# Patient Record
Sex: Male | Born: 1955 | Race: Black or African American | Hispanic: No | Smoking: Current every day smoker
Health system: Southern US, Community
[De-identification: ages and names within clinical notes are randomized; demographics above are authoritative.]

## PROBLEM LIST (undated history)

## (undated) ENCOUNTER — Ambulatory Visit: Source: Home / Self Care

## (undated) ENCOUNTER — Ambulatory Visit (HOSPITAL_COMMUNITY): Admission: EM | Payer: 59 | Source: Home / Self Care

## (undated) DIAGNOSIS — N289 Disorder of kidney and ureter, unspecified: Secondary | ICD-10-CM

## (undated) DIAGNOSIS — B1920 Unspecified viral hepatitis C without hepatic coma: Secondary | ICD-10-CM

## (undated) DIAGNOSIS — H409 Unspecified glaucoma: Secondary | ICD-10-CM

## (undated) DIAGNOSIS — H269 Unspecified cataract: Secondary | ICD-10-CM

## (undated) DIAGNOSIS — H9190 Unspecified hearing loss, unspecified ear: Secondary | ICD-10-CM

---

## 1999-03-17 ENCOUNTER — Emergency Department (HOSPITAL_COMMUNITY): Admission: EM | Admit: 1999-03-17 | Discharge: 1999-03-17 | Payer: Self-pay | Admitting: Emergency Medicine

## 1999-03-17 ENCOUNTER — Encounter: Payer: Self-pay | Admitting: Emergency Medicine

## 1999-08-03 ENCOUNTER — Emergency Department (HOSPITAL_COMMUNITY): Admission: EM | Admit: 1999-08-03 | Discharge: 1999-08-03 | Payer: Self-pay | Admitting: Emergency Medicine

## 2001-11-03 ENCOUNTER — Emergency Department (HOSPITAL_COMMUNITY): Admission: EM | Admit: 2001-11-03 | Discharge: 2001-11-03 | Payer: Self-pay | Admitting: *Deleted

## 2002-11-06 ENCOUNTER — Emergency Department (HOSPITAL_COMMUNITY): Admission: EM | Admit: 2002-11-06 | Discharge: 2002-11-06 | Payer: Self-pay | Admitting: Emergency Medicine

## 2006-09-09 ENCOUNTER — Emergency Department (HOSPITAL_COMMUNITY): Admission: EM | Admit: 2006-09-09 | Discharge: 2006-09-09 | Payer: Self-pay | Admitting: Emergency Medicine

## 2006-09-15 ENCOUNTER — Emergency Department (HOSPITAL_COMMUNITY): Admission: EM | Admit: 2006-09-15 | Discharge: 2006-09-15 | Payer: Self-pay | Admitting: Emergency Medicine

## 2006-10-07 ENCOUNTER — Emergency Department (HOSPITAL_COMMUNITY): Admission: EM | Admit: 2006-10-07 | Discharge: 2006-10-07 | Payer: Self-pay | Admitting: Emergency Medicine

## 2006-11-04 ENCOUNTER — Emergency Department (HOSPITAL_COMMUNITY): Admission: EM | Admit: 2006-11-04 | Discharge: 2006-11-04 | Payer: Self-pay | Admitting: Emergency Medicine

## 2006-11-25 ENCOUNTER — Ambulatory Visit: Payer: Self-pay | Admitting: Internal Medicine

## 2008-05-08 ENCOUNTER — Emergency Department (HOSPITAL_COMMUNITY): Admission: EM | Admit: 2008-05-08 | Discharge: 2008-05-08 | Payer: Self-pay | Admitting: Emergency Medicine

## 2008-12-03 ENCOUNTER — Emergency Department (HOSPITAL_COMMUNITY): Admission: EM | Admit: 2008-12-03 | Discharge: 2008-12-03 | Payer: Self-pay | Admitting: Emergency Medicine

## 2008-12-13 ENCOUNTER — Emergency Department (HOSPITAL_COMMUNITY): Admission: EM | Admit: 2008-12-13 | Discharge: 2008-12-13 | Payer: Self-pay | Admitting: Emergency Medicine

## 2010-01-07 ENCOUNTER — Emergency Department (HOSPITAL_COMMUNITY)
Admission: EM | Admit: 2010-01-07 | Discharge: 2010-01-07 | Payer: Self-pay | Source: Home / Self Care | Admitting: Emergency Medicine

## 2010-01-23 ENCOUNTER — Encounter (INDEPENDENT_AMBULATORY_CARE_PROVIDER_SITE_OTHER): Payer: Self-pay | Admitting: *Deleted

## 2010-01-23 ENCOUNTER — Ambulatory Visit: Payer: Self-pay | Admitting: Internal Medicine

## 2010-01-23 LAB — CONVERTED CEMR LAB: HCV Quantitative: 2160000 intl units/mL — ABNORMAL HIGH (ref <43)

## 2010-04-11 ENCOUNTER — Emergency Department (HOSPITAL_COMMUNITY)
Admission: EM | Admit: 2010-04-11 | Discharge: 2010-04-11 | Payer: Self-pay | Source: Home / Self Care | Admitting: Emergency Medicine

## 2010-05-29 ENCOUNTER — Emergency Department (HOSPITAL_COMMUNITY): Payer: Medicaid Other

## 2010-05-29 ENCOUNTER — Inpatient Hospital Stay (HOSPITAL_COMMUNITY)
Admission: EM | Admit: 2010-05-29 | Discharge: 2010-05-31 | DRG: 375 | Disposition: A | Discharge status: Home / Self Care | Payer: Medicaid Other | Attending: Internal Medicine | Admitting: Internal Medicine

## 2010-05-29 DIAGNOSIS — D6959 Other secondary thrombocytopenia: Secondary | ICD-10-CM | POA: Diagnosis present

## 2010-05-29 DIAGNOSIS — F17200 Nicotine dependence, unspecified, uncomplicated: Secondary | ICD-10-CM | POA: Diagnosis present

## 2010-05-29 DIAGNOSIS — N182 Chronic kidney disease, stage 2 (mild): Secondary | ICD-10-CM | POA: Diagnosis present

## 2010-05-29 DIAGNOSIS — M899 Disorder of bone, unspecified: Secondary | ICD-10-CM | POA: Diagnosis present

## 2010-05-29 DIAGNOSIS — K5732 Diverticulitis of large intestine without perforation or abscess without bleeding: Secondary | ICD-10-CM | POA: Diagnosis present

## 2010-05-29 DIAGNOSIS — F1020 Alcohol dependence, uncomplicated: Secondary | ICD-10-CM | POA: Diagnosis present

## 2010-05-29 DIAGNOSIS — C189 Malignant neoplasm of colon, unspecified: Principal | ICD-10-CM | POA: Diagnosis present

## 2010-05-29 DIAGNOSIS — B1920 Unspecified viral hepatitis C without hepatic coma: Secondary | ICD-10-CM | POA: Diagnosis present

## 2010-05-29 DIAGNOSIS — R3911 Hesitancy of micturition: Secondary | ICD-10-CM | POA: Diagnosis present

## 2010-05-29 DIAGNOSIS — E871 Hypo-osmolality and hyponatremia: Secondary | ICD-10-CM | POA: Diagnosis present

## 2010-05-29 LAB — BASIC METABOLIC PANEL WITH GFR
BUN: 25 mg/dL — ABNORMAL HIGH (ref 6–23)
CO2: 19 meq/L (ref 19–32)
Calcium: 8.6 mg/dL (ref 8.4–10.5)
Chloride: 106 meq/L (ref 96–112)
Creatinine, Ser: 1.5 mg/dL (ref 0.4–1.5)
GFR calc non Af Amer: 49 mL/min — ABNORMAL LOW
Glucose, Bld: 84 mg/dL (ref 70–99)
Potassium: 3.9 meq/L (ref 3.5–5.1)
Sodium: 133 meq/L — ABNORMAL LOW (ref 135–145)

## 2010-05-29 LAB — URINALYSIS, ROUTINE W REFLEX MICROSCOPIC
Bilirubin Urine: NEGATIVE
Hgb urine dipstick: NEGATIVE
Ketones, ur: 15 mg/dL — AB
Nitrite: NEGATIVE
Protein, ur: NEGATIVE mg/dL
Specific Gravity, Urine: 1.021 (ref 1.005–1.030)
Urine Glucose, Fasting: NEGATIVE mg/dL
Urobilinogen, UA: 0.2 mg/dL (ref 0.0–1.0)
pH: 5 (ref 5.0–8.0)

## 2010-05-29 LAB — DIFFERENTIAL
Basophils Relative: 0 % (ref 0–1)
Eosinophils Absolute: 0.1 10*3/uL (ref 0.0–0.7)
Eosinophils Relative: 1 % (ref 0–5)
Lymphs Abs: 2.3 10*3/uL (ref 0.7–4.0)
Monocytes Relative: 7 % (ref 3–12)

## 2010-05-29 LAB — CBC
HCT: 37.2 % — ABNORMAL LOW (ref 39.0–52.0)
Hemoglobin: 12.8 g/dL — ABNORMAL LOW (ref 13.0–17.0)
MCH: 32.2 pg (ref 26.0–34.0)
MCHC: 34.4 g/dL (ref 30.0–36.0)
MCV: 93.7 fL (ref 78.0–100.0)
Platelets: 112 10*3/uL — ABNORMAL LOW (ref 150–400)
RBC: 3.97 MIL/uL — ABNORMAL LOW (ref 4.22–5.81)
RDW: 12.2 % (ref 11.5–15.5)
WBC: 12.2 10*3/uL — ABNORMAL HIGH (ref 4.0–10.5)

## 2010-05-30 ENCOUNTER — Inpatient Hospital Stay (HOSPITAL_COMMUNITY): Payer: Medicaid Other

## 2010-05-30 ENCOUNTER — Emergency Department (HOSPITAL_COMMUNITY): Payer: Medicaid Other

## 2010-05-30 LAB — PSA: PSA: 0.24 ng/mL (ref <=4.00)

## 2010-05-30 LAB — RAPID URINE DRUG SCREEN, HOSP PERFORMED
Amphetamines: NOT DETECTED
Opiates: POSITIVE — AB
Tetrahydrocannabinol: NOT DETECTED

## 2010-05-30 LAB — HEPATIC FUNCTION PANEL
AST: 33 U/L (ref 0–37)
Bilirubin, Direct: 0.1 mg/dL (ref 0.0–0.3)

## 2010-05-30 LAB — IRON AND TIBC
Iron: 109 ug/dL (ref 42–135)
TIBC: 344 ug/dL (ref 215–435)

## 2010-05-30 MED ORDER — TECHNETIUM TC 99M MEDRONATE IV KIT
25.0000 | PACK | Freq: Once | INTRAVENOUS | Status: AC | PRN
  Administered 2010-05-30: 26 via INTRAVENOUS

## 2010-05-30 MED ORDER — IOHEXOL 300 MG/ML  SOLN
100.0000 mL | Freq: Once | INTRAMUSCULAR | Status: AC | PRN
  Administered 2010-05-30: 100 mL via INTRAVENOUS

## 2010-05-31 LAB — CBC
HCT: 34.8 % — ABNORMAL LOW (ref 39.0–52.0)
MCV: 92.8 fL (ref 78.0–100.0)
RBC: 3.75 MIL/uL — ABNORMAL LOW (ref 4.22–5.81)
RDW: 12.1 % (ref 11.5–15.5)
WBC: 5.3 10*3/uL (ref 4.0–10.5)

## 2010-05-31 LAB — COMPREHENSIVE METABOLIC PANEL
Alkaline Phosphatase: 65 U/L (ref 39–117)
BUN: 9 mg/dL (ref 6–23)
CO2: 24 mEq/L (ref 19–32)
Chloride: 109 mEq/L (ref 96–112)
GFR calc non Af Amer: 54 mL/min — ABNORMAL LOW (ref >60)
Glucose, Bld: 120 mg/dL — ABNORMAL HIGH (ref 70–99)
Potassium: 3.8 mEq/L (ref 3.5–5.1)
Total Bilirubin: 0.5 mg/dL (ref 0.3–1.2)

## 2010-05-31 LAB — PROTIME-INR: Prothrombin Time: 13 seconds (ref 11.6–15.2)

## 2010-05-31 LAB — AMYLASE: Amylase: 153 U/L — ABNORMAL HIGH (ref 0–105)

## 2010-06-03 NOTE — Discharge Summary (Signed)
NAME:  Jeffrey [name], Jeffrey [name] NO.:  [id number]  MEDICAL RECORD NO.:  [id number]           PATIENT TYPE:  LOCATION:                                 FACILITY:  PHYSICIAN:  Andreas Blower, MD       DATE OF BIRTH:  1956/03/08  DATE OF ADMISSION: DATE OF DISCHARGE:                              DISCHARGE SUMMARY   PRIMARY CARE PHYSICIAN:  The patient does not have a primary care physician.  GASTROENTEROLOGIST:  During the hospital stay was Dr. Ewing Schlein.  DISCHARGE DIAGNOSES: 1. Diverticulitis with possible underlying malignancy as noted on CT     scan. 2. Hypodense skeletal lesion with negative bone scan study. 3. Tobacco use. 4. Alcoholism. 5. Chronic kidney disease stage I-II. 6. History of hepatitis C. 7. Thrombocytopenia, likely from alcohol use and hepatitis C. 8. Hyponatremia, resolved. 9. History of glaucoma. 10.History of cataract. 11.History of corneal abrasion. 12.History of occasional urinary retention and hesitancy. 13.History of left shoulder pain.  DISCHARGE MEDICATIONS: 1. Ciprofloxacin 500 mg p.o. twice daily for 12 more days. 2. Folic acid 1 mg p.o. daily. 3. Metronidazole 500 mg p.o. three times a day for 12 days. 4. Nicotine patch 21 mg per 24 hours daily, the patient was instructed     not to smoke while on the patch. 5. Thiamine 100 mg p.o. daily.  BRIEF ADMITTING HISTORY AND PHYSICAL:  Jeffrey [name] is a 55 year old African American gentleman with no significant past medical history who has no primary care physician, who drinks about four beers per day and smokes about one pack per day, presented with abdominal pain, nausea, vomiting.  RADIOLOGY/IMAGING:  The patient had CT of the abdomen and pelvis with contrast which showed focal soft tissue prominence and some free fluid in the proximal sigmoid and descending colon.  This likely represents focal diverticulitis and neoplasm is not excluded.  Mild stranding of the small bowel mesentery  without focal soft tissue lesion.  Multiple hypodense lesions in the skeleton could represent metastatic disease as the patient has a known cancer. The patient had a chest x-ray, two view, which was negative chest. The patient had a whole body bone scan on May 30, 2010, which shows no evidence of abnormal osseous septic.  CONSULTATIONS OBTAINED:  Petra Kuba, MD, with [facility], was consulted for probable diverticulitis and for ruling out neoplasm to determine to see if the patient needs a colonoscopy.  LABORATORY DATA:  CBC shows a white count of 5.3, hemoglobin 11.9, hematocrit 34.8, platelet count 117.  Electrolytes normal with a creatinine of 1.38.  Liver function tests normal except albumin is 2.9, alkaline phosphatase was normal.  Amylase was 153.  Anemia panel was normal.  PSA was 0.24.  TSH was 1.711.  Urine drug screen was positive for opiates.  UA was negative for nitrites and leukocytes.  Blood cultures x2 shows no growth to date.  DISPOSITION AND FOLLOWUP:  The patient was instructed to call Medicaid to determine which provider would accept the Medicaid and be his primary care physician.  The patient reports that he has a number at home that  he can call to establish a primary care physician.  The patient was instructed to call Dr. Ewing Schlein with GI for an appointment in 1-2 weeks.  HOSPITAL COURSE BY PROBLEM: 1. Abdominal pain, likely due to diverticulitis, however, could not     exclude neoplasm.  The patient was started on ciprofloxacin and     metronidazole which were transitioned from IV to oral during the     course of hospital stay.  The patient will continue antibiotics for     12 more days to complete a 14-day course of antibiotics.  After     discharge, the patient was instructed to call Dr. Ewing Schlein for     followup and for colonoscopy as an outpatient.  The patient's PSA     was checked and was normal, alk phos was normal.  Colonoscopy will     help to  determine to see if he has any possible underlying     neoplasm. 2. Hypodense skeletal lesions as noted on CT.  The patient had a whole     body bone scan which was normal.  Alkaline phosphatase was normal.     PSA was normal. 3. Tobacco use.  I had an extensive discussion with the patient about     smoking cessation and spent more than 10 minutes counseling the     patient.  Nicotine patches were prescribed at discharge. 4. Alcohol use.  The patient was instructed on stopping alcohol use.     The patient is given thiamine and folate.  The patient was offered     resources to be provided for alcohol cessation.  He indicated that     he is going to try quitting and drinking on his own. 5. Chronic kidney disease, stage I/II.  Continue to follow up with his     primary care physician for further evaluation as an outpatient. 6. Thrombocytopenia, most likely secondary to alcohol use and     hepatitis C. 7. Hyponatremia, resolved. 8. History of hepatitis C, not an active issue at this time.  TIME SPENT ON DISCHARGE:  Talking to the patient and coordinating care was 25 minutes.     Andreas Blower, MD     SR/MEDQ  D:  05/31/2010  T:  06/01/2010  Job:  [id number]  Electronically Signed by Wardell Heath Hersel Mcmeen  on 06/03/2010 09:01:27 PM

## 2010-06-05 LAB — CULTURE, BLOOD (ROUTINE X 2)
Culture  Setup Time: 201202241107
Culture: NO GROWTH

## 2010-06-12 NOTE — H&P (Signed)
NAME:  Jeffrey, [name] NO.:  [id number]  MEDICAL RECORD NO.:  [id number]           PATIENT TYPE:  E  LOCATION:  [facility]                         FACILITY:  [facility]  PHYSICIAN:  Lonia Blood, M.D.      DATE OF BIRTH:  09/03/1955  DATE OF ADMISSION:  05/29/2010 DATE OF DISCHARGE:                             HISTORY & PHYSICAL   PRIMARY CARE PHYSICIAN:  He is unassigned to [place].  PRESENTING COMPLAINT:  Abdominal pain, fever and nausea.  HISTORY OF PRESENT ILLNESS:  The patient is a 55 year old gentleman with no significant past medical history, who has no primary care physician and not been following for regular follow ups.  He is a heavy drinker and smoker.  He came to the ER due to persistent left lower quadrant abdominal pain associated with some nausea.  The patient also has had some fever and chills.  He described "his bowel locked up on him" last Tuesday.  This has been going on for 3 days.  He has no bowel movement since then.  He saw some black stools on Tuesday, which is 2 days ago. No bright red blood per rectum.  No hematemesis.  His pain is rated as 7/10 worsened with movement and relieved by some rest.  He has no prior history of pain.  He has never had a colonoscopy or an EGD and no prior GI workup.  PAST MEDICAL HISTORY:  Significant for 1. Cataract. 2. Corneal abrasion. 3. History of glaucoma. 4. Hepatitis C. 5. History of left shoulder pain. 6. Occasional urinary retention and hesitancy.  ALLERGIES:  HE HAS NO KNOWN DRUG ALLERGIES.  MEDICATIONS:  He is not on any medicine at this point.  SOCIAL HISTORY:  The patient lives in [place].  He is divorced with 2 grown-up children.  He smokes about one and a half pack per day.  Drinks up to 6 packs beer a day.  Denied any IV drug use or any illicit drugs.  FAMILY HISTORY:  Mother is alive at the age of 80.  She had lung cancer and some uterine cancer.  She smokes also excessively.  His father  is also alive, but also has history of lung cancer.  REVIEW OF SYSTEMS:  Denied any weight loss.  Otherwise, all systems reviewed are negative except per HPI.  PHYSICAL EXAMINATION:  VITAL SIGNS:  Temperature is 99.2 orally, blood pressure 120/73, pulse 180, respiratory 20, sats 98% room air. GENERAL:  The patient is awake, alert, oriented, small-framed individual.  He is in no acute distress. HEENT:  PERRL.  EOMI.  No pallor, no jaundice.  No rhinorrhea. NECK:  Supple.  No visible JVD, no lymphadenopathy. RESPIRATORY:  He has good air entry bilaterally.  No wheezes, no rales, no crackles. CARDIOVASCULAR SYSTEM:  He is mildly tachycardiac. ABDOMEN:  Full, soft with left lower quadrant abdominal tenderness with mild guarding.  Stool guaiac is positive. EXTREMITIES:  No edema, cyanosis or clubbing. SKIN:  No rashes.  No ulcers. MUSCULOSKELETAL:  No joint swelling, tenderness, or limitation in range of motion.  LABORATORY DATA:  White count is 12.2 with  left shift, ANC of 8.9. Hemoglobin is 12.8, platelet count of 112,000.  Sodium is 133, potassium 3.9, chloride 106, CO2 of 19, glucose 84, BUN 25, creatinine 1.50, calcium 8.6.  His urinalysis is so far negative except for some mild ketones.  LFTs are currently pending.  His chest x-ray shows negative findings.  CT abdomen and pelvis showed focal soft tissue prominence and some free fluid in the proximal sigmoid and descending colon likely representing focal diverticulitis.  A neoplasm has not been excluded. He has mild stranding of the small bowel mesentery without a focal soft tissue lesion, which may be associated with a nonspecific enteritis.  He has multiple hypodense lesions in the skeleton.  These involved the vertebral body L3 as well as the left iliac bone.  Questionable metastatic disease.  ASSESSMENT:  This is a 55 year old gentleman who is a heavy smoker and drinker, with family history of multiple cancers, presenting  with what appears to be acute diverticulitis, bone lesions and possible neoplasm. The patient denied any weight loss.  There is no prior history of cancer, although he has all the risk factors.  PLAN: 1. Acute diverticulitis, we will admit the patient, get blood     cultures, bowel rest, pain control and start him on Cipro, Flagyl.     The patient will need GI consult.  If this lesion did not disappear     or improve on antibiotics, he will need colonoscopy and biopsy to     rule out tumor. 2. Bone lesions.  It is not clear if this is truly metastatic disease.     We will do a total body bone scan.  Depending on the findings, we     have to think of ruling out myeloma.  The findings are hypodense,     not sclerotic, however, the patient is complaining of urinary     symptoms, so we need to think of prostate cancer as a possibility.     At this point, I will order a PSA panel, does a screening. 3. Tobacco abuse.  We will start tobacco cessation counseling and I     will put nicotine patch. 4. Alcohol abuse.  We will watch the patient for alcohol withdrawal,     in the meantime, I will put him on some Ativan as needed.  Once we     see signs of withdrawal, we will start CIWA  protocol. 5. History of hepatitis C.  The patient has not had any follow-up     apparently in a long time.  He is thrombocytopenic, we will check     his LFTs and see what the possible level of his liver functioning     is. 6. Thrombocytopenia.  Again, there is a high chance that the patient     is not cirrhotic.  We will check his liver function and continue to     monitor.  He needs to be counseled at post hospitalization, he will     need to follow-up with someone.  He may require right upper     quadrant abdominal ultrasound if his LFTs are well elevated. 7. Hyponatremia.  Again, may be related to some liver disease or some     mild dehydration.  Further treatment will depend on the patient's     response as  mentioned.     Lonia Blood, M.D.     Verlin Grills  D:  05/30/2010  T:  05/30/2010  Job:  [id number]  Electronically Signed by Lonia Blood M.D. on 06/12/2010 06:30:51 AM

## 2010-06-13 NOTE — Consult Note (Signed)
NAME:  Jeffrey [name], Jeffrey [name] NO.:  [id number]  MEDICAL RECORD NO.:  [id number]           PATIENT TYPE:  I  LOCATION:  6705                         FACILITY:  [facility]  PHYSICIAN:  Petra Kuba, M.D.    DATE OF BIRTH:  Jun 12, 1955  DATE OF CONSULTATION:  05/30/2010 DATE OF DISCHARGE:                                CONSULTATION   Patient seen at the request of the medical staff with a few-day history of change in bowel habits, some abdominal pain, and nausea and vomiting. He was admitted with probable diverticulitis, but on CT cannot rule out colon cancer.  We are consulted for further workup and plans.  He does have a history of drug abuse and hepatitis C, and has had no previous colonic procedure.  PAST MEDICAL HISTORY:  Pertinent for cataracts and corneal abrasions, glaucoma, hep C, left shoulder pain, and occasional urinary symptoms.  ALLERGIES:  None.  MEDICATIONS AT HOME:  None.  Currently, he is on antibiotics.  SOCIAL HISTORY:  He does smoke and does drink beer, currently denying any IV drug use or illicit drugs.  CURRENT MEDICATIONS:  In the hospital include albuterol, Cipro, Lovenox, folic acid, Atrovent, Flagyl, thiamine, lorazepam, morphine, Zofran, Phenergan.  FAMILY HISTORY:  Negative for any GI problems.  REVIEW OF SYSTEMS:  Pertinent for an hour ago was sitting up in his room, eating lunch, doing well, but after the bone scan was feeling just weak and tired.  PHYSICAL EXAMINATION:  Abdomen is soft, nontender.  He is currently not having any guarding or rebound.  He was guaiac positive per the ER physician.  CT was reviewed with Charlett Nose, Radiology.  Pertinent for probable diverticulitis without significant adenopathy.  The liver is normal. There is a question of some bone lesions on the CT as well, and that is why the bone scan is done, but the results are pending.  Labs pertinent for white count of 12.2, hemoglobin 12.8 with an MCV of 93,  platelet count of 112, BUN of 25, creatinine of 1.5.  Previous creatinine was 1.6 and a previous hemoglobin was 12.7, MCV 95 in the past with platelets of 129.  Liver tests were normal except for an albumin of 3.0.  ASSESSMENT: 1. Probable diverticulitis, cannot rule out colon cancer. 2. Bone lesions, questionable etiology.  Bone scan pending. 3. Hepatitis C with normal liver tests and normal liver and spleen on     CT scan.  PLAN:  We will treat with 10-14 days of antibiotics and then see back in the office and schedule an outpatient colonoscopy to confirm the diverticular disease and rule out any other etiologies like colon cancer.  I have discussed that with the patient and wrote my name and number on his discharge paperwork.  Please call one of my partners or myself if any questions or problems during this hospitalization that we can help with.  Otherwise, wait on bone scan results.  However, it would be rare for colon cancer to go to bones and agree with the PSA which is drawn and pending which would be much more common, but  if question about colon cancer, could also get a CEA as well.          ______________________________ Petra Kuba, M.D.     MEM/MEDQ  D:  05/30/2010  T:  05/31/2010  Job:  [id number]  Electronically Signed by Vida Rigger M.D. on 06/12/2010 08:03:19 AM

## 2010-06-19 LAB — DIFFERENTIAL
Basophils Absolute: 0.1 10*3/uL (ref 0.0–0.1)
Basophils Relative: 1 % (ref 0–1)
Lymphocytes Relative: 19 % (ref 12–46)
Monocytes Absolute: 0.9 10*3/uL (ref 0.1–1.0)
Neutro Abs: 6.5 10*3/uL (ref 1.7–7.7)

## 2010-06-19 LAB — CBC
HCT: 37.3 % — ABNORMAL LOW (ref 39.0–52.0)
MCHC: 34 g/dL (ref 30.0–36.0)
Platelets: 129 10*3/uL — ABNORMAL LOW (ref 150–400)
RDW: 11.9 % (ref 11.5–15.5)
WBC: 9.4 10*3/uL (ref 4.0–10.5)

## 2010-06-19 LAB — POCT I-STAT, CHEM 8
BUN: 15 mg/dL (ref 6–23)
Calcium, Ion: 1.05 mmol/L — ABNORMAL LOW (ref 1.12–1.32)
Chloride: 99 mEq/L (ref 96–112)
HCT: 41 % (ref 39.0–52.0)
Sodium: 135 mEq/L (ref 135–145)
TCO2: 30 mmol/L (ref 0–100)

## 2010-12-30 ENCOUNTER — Encounter: Payer: Self-pay | Admitting: Internal Medicine

## 2011-01-11 ENCOUNTER — Ambulatory Visit (HOSPITAL_COMMUNITY)
Admission: RE | Admit: 2011-01-11 | Discharge: 2011-01-11 | Disposition: A | Discharge status: Home / Self Care | Payer: Medicaid Other | Source: Ambulatory Visit | Attending: Internal Medicine | Admitting: Internal Medicine

## 2011-01-11 ENCOUNTER — Other Ambulatory Visit (HOSPITAL_COMMUNITY): Payer: Self-pay | Admitting: Internal Medicine

## 2011-01-11 DIAGNOSIS — R05 Cough: Secondary | ICD-10-CM | POA: Insufficient documentation

## 2011-01-11 DIAGNOSIS — R059 Cough, unspecified: Secondary | ICD-10-CM | POA: Insufficient documentation

## 2011-01-11 DIAGNOSIS — R0602 Shortness of breath: Secondary | ICD-10-CM

## 2011-01-29 ENCOUNTER — Ambulatory Visit: Payer: Medicaid Other | Admitting: Internal Medicine

## 2011-02-10 ENCOUNTER — Encounter: Payer: Self-pay | Admitting: Internal Medicine

## 2011-03-04 ENCOUNTER — Ambulatory Visit: Payer: Medicaid Other | Admitting: Internal Medicine

## 2011-03-11 ENCOUNTER — Encounter: Payer: Self-pay | Admitting: Internal Medicine

## 2011-03-16 ENCOUNTER — Ambulatory Visit: Payer: Medicaid Other | Admitting: Internal Medicine

## 2011-04-30 ENCOUNTER — Ambulatory Visit (INDEPENDENT_AMBULATORY_CARE_PROVIDER_SITE_OTHER): Payer: Medicaid Other | Admitting: Gastroenterology

## 2011-04-30 DIAGNOSIS — B182 Chronic viral hepatitis C: Secondary | ICD-10-CM

## 2011-04-30 DIAGNOSIS — K7030 Alcoholic cirrhosis of liver without ascites: Secondary | ICD-10-CM

## 2011-05-07 NOTE — Progress Notes (Signed)
NAME:  Jeffrey [name], Jeffrey [name]  MR#:  [id number]      DATE:  04/30/2011  DOB:  [birth year]    cc: Consulting Physician:  None. Primary care physician:  Same. Referring Physician:  Quitman Livings, MD, [facility], [address] [place], [place] [address], Phone [phone], Fax [phone]    Reason for referral:  Genotype 1b hepatitis C.    History:  The patient is a 56 year old gentleman who I have been asked to see in consultation by Dr. Leticia Clas regarding his genotype 1b hepatitis C. It should be noted that he is a poor historian, and I received little in  the way of any records.  The patient recalled being told he had hepatitis C perhaps 15 years ago. He reports that he was seen in the past regarding this at [facility], although I have no independent confirmation of this. He cannot  recall his genotype nor can he recall undergoing a liver biopsy. Indeed I do not find any record of a liver biopsy in the electronic medical record system. He had a new CT scan of the abdomen on  05/30/2010, which showed the liver was normal on an IV contrast-enhanced CT scan of the abdomen. There are no symptoms referable to his history of hepatitis C nor are there no symptoms to suggest  cryoglobulin mediated or decompensated liver disease.  With respect to risk factors for liver disease, he drank a half pint of liquor per day for 10 years until he stopped in what he describes January 2012, but I suspect it was, in fact, February 2012 from  looking at his notes within epic. He has 9 DWIs last one being in 2007. He also mentions a history of intravenous and intranasal cocaine use until 2007, but no tattoos, unsterile body piercing, blood  transfusion prior to 1992. There is no family history of liver disease. He cannot recall if he was ever vaccinated against hepatitis A or B.   PAST MEDICAL HISTORY:  Significant for cataract and corneal abrasion. He reports that he may have had a history of  glaucoma, but cannot recall the name in the  ophthalmologist he has seen in the past. He denies diabetes, dyslipidemia, coronary disease, or hypertension.   PAST SURGICAL HISTORY:  Denies. Other than perhaps some form of eye surgery    Past psychiatric history:  Denies.   CURRENT MEDICATIONS:  None.   ALLERGIES:  Denies.    Habits:  Smoking, half pack of cigarettes per day. Alcohol as above.   FAMILY HISTORY:  As above.   SOCIAL HISTORY:  Divorced. He has 2 children from his previous relationships. 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 He reports he lives with  his sister. He came on the bus today. He cannot drive because of his eye history.   REVIEW OF SYSTEMS:  All 10 systems reviewed today on the review of systems form, which was signed and placed in the chart. His CES-D was incomplete, but it was 27 at least.    PHYSICAL EXAMINATION:   CONSTITUTIONAL:  Thin appearing but otherwise no stigmata of chronic liver disease. Vital signs: Height 68 inches, weight 125 pounds, blood pressure 151/109, pulse 71, temperature 98.3 Fahrenheit.  Ears, nose,  mouth and throat:  Unremarkable oropharynx.  No thyromegaly or neck masses.  Chest:  Resonant to percussion.  Clear to auscultation.  Cardiovascular:  Heart sounds normal  S1, S2 without murmurs or rubs.   There is no peripheral edema.  Abdominal:  Normal bowel sounds.  No masses or tenderness.  I could not appreciate a liver edge or spleen tip.  I could not appreciate any hernias.  Lymphatics:  No cervical or  inguinal lymphadenopathy.  Central Nervous System:  No asterixis or focal neurologic findings.  Dermatologic:  Anicteric without palmar  erythema or spider angiomata.  Eyes:  Anicteric sclerae.  Pupils are equal and reactive to light.   laboratories:  On 03/03/2011; CBC, white count 5.1, hemoglobin 12.8, MCV 100, platelet count 120. Creatinine was 1.73,  albumin was 5.0, globulins 3.3, total bilirubin 0.4, AST 365, ALT 266, ALP 75.  HIV was negative.  Hepatitis C antibody was positive. Hepatitis B surface antigen was negative.   On 12/15/2010; GGT was 445, and his AST was 340, ALT 263, ALP 69 with a total bilirubin 0.5, and an albumin 4.8.   On 01/23/2010; his genotype was 1b and his viral load was 2,160,000 international units per mL.   Assessment:  The patient is a 56 year old gentleman with history of genotype 1b hepatitis C, IL-28B unknown, naive to treatment, never been biopsied. There may be an element of alcohol-induced liver disease, as well.  There is some suggestion of perhaps a more advanced fibrosis due to his relative degree of thrombocytopenia from his CBC on 03/03/2011, but I note his CT scan from 05/30/2010, showed the liver was normal  without evidence of portal hypertension.   In terms of his hepatitis C, I do not see any overt contraindication to treating him provided his synthetic function is intact, and he is able to comply with direction.  There is no indication of cirrhosis as of yet, other than the low platelet count, to suggest any further interventions are needed. Further lab work order will clarify this.  In addition, as a genotype 1b hepatitis C patient he should be biopsied considering that there is no contraindication to treating him as it stands.  In my discussion today with the patient, we discussed the nature and natural history of genotype 1b hepatitis C.  We discussed liver biopsy, and the role it plays in determining the need for treatment.  We also discussed treatment with pegylated interferon, and ribavirin, and a protease inhibitor. I discussed our treatment protocol for our clinic and the response rates for treatment. I discussed the specific  system, constitutional, and psychiatric side effects of therapy, as well. We discussed the risk of contagion. He understands he must not drink alcohol. I have explained  to him if he is to undergo a liver  biopsy, that he would need to bring a reliable source of transportation with him. We discussed the procedure itself, and its risks including but not limited to, bleeding, pain, and perforation of  the surrounding organ. Having said all this, the patient would like to proceed with the evaluation by starting with his labs today and the  biopsy, and then deciding on the need for treatment based on these results.   plan:  1. Literature on hepatitis C given. 2. Testing for hepatitis A and B immunity. 3. Standard labs. 4. I have not checked an IL-28 as of yet because if he is a treatment candidate most the duration of treatment will be determined based on his kinetics and the presence or absence of fibrosis. 5. Will arrange for liver biopsy in the coming weeks. 6. Follow up will be determined based on results of  the labs and the biopsy.            Brooke Dare, MD    ADDENDUM Labs not done as of signing.  [id number] .[id number]  D:  Thu Jan 24 17:30:03 2013 ; T:  Thu Jan 24 19:28:29 2013  Job #:  [id number]

## 2012-09-11 ENCOUNTER — Encounter (HOSPITAL_COMMUNITY): Payer: Self-pay | Admitting: Emergency Medicine

## 2012-09-11 ENCOUNTER — Emergency Department (HOSPITAL_COMMUNITY): Payer: Medicaid Other

## 2012-09-11 ENCOUNTER — Emergency Department (HOSPITAL_COMMUNITY)
Admission: EM | Admit: 2012-09-11 | Discharge: 2012-09-12 | Disposition: A | Discharge status: Home / Self Care | Payer: Medicaid Other | Attending: Emergency Medicine | Admitting: Emergency Medicine

## 2012-09-11 DIAGNOSIS — N289 Disorder of kidney and ureter, unspecified: Secondary | ICD-10-CM | POA: Insufficient documentation

## 2012-09-11 DIAGNOSIS — F17200 Nicotine dependence, unspecified, uncomplicated: Secondary | ICD-10-CM | POA: Insufficient documentation

## 2012-09-11 DIAGNOSIS — R937 Abnormal findings on diagnostic imaging of other parts of musculoskeletal system: Secondary | ICD-10-CM | POA: Insufficient documentation

## 2012-09-11 DIAGNOSIS — D539 Nutritional anemia, unspecified: Secondary | ICD-10-CM | POA: Insufficient documentation

## 2012-09-11 DIAGNOSIS — R071 Chest pain on breathing: Secondary | ICD-10-CM | POA: Insufficient documentation

## 2012-09-11 DIAGNOSIS — H9190 Unspecified hearing loss, unspecified ear: Secondary | ICD-10-CM | POA: Insufficient documentation

## 2012-09-11 DIAGNOSIS — H409 Unspecified glaucoma: Secondary | ICD-10-CM | POA: Insufficient documentation

## 2012-09-11 DIAGNOSIS — B1920 Unspecified viral hepatitis C without hepatic coma: Secondary | ICD-10-CM | POA: Insufficient documentation

## 2012-09-11 DIAGNOSIS — M899 Disorder of bone, unspecified: Secondary | ICD-10-CM

## 2012-09-11 DIAGNOSIS — H269 Unspecified cataract: Secondary | ICD-10-CM | POA: Insufficient documentation

## 2012-09-11 DIAGNOSIS — D696 Thrombocytopenia, unspecified: Secondary | ICD-10-CM

## 2012-09-11 DIAGNOSIS — R634 Abnormal weight loss: Secondary | ICD-10-CM | POA: Insufficient documentation

## 2012-09-11 DIAGNOSIS — R748 Abnormal levels of other serum enzymes: Secondary | ICD-10-CM | POA: Insufficient documentation

## 2012-09-11 HISTORY — DX: Unspecified hearing loss, unspecified ear: H91.90

## 2012-09-11 HISTORY — DX: Unspecified viral hepatitis C without hepatic coma: B19.20

## 2012-09-11 HISTORY — DX: Unspecified glaucoma: H40.9

## 2012-09-11 HISTORY — DX: Unspecified cataract: H26.9

## 2012-09-11 LAB — COMPREHENSIVE METABOLIC PANEL
ALT: 132 U/L — ABNORMAL HIGH (ref 0–53)
AST: 215 U/L — ABNORMAL HIGH (ref 0–37)
Albumin: 3.2 g/dL — ABNORMAL LOW (ref 3.5–5.2)
Alkaline Phosphatase: 186 U/L — ABNORMAL HIGH (ref 39–117)
CO2: 29 mEq/L (ref 19–32)
Chloride: 104 mEq/L (ref 96–112)
Potassium: 4.1 mEq/L (ref 3.5–5.1)
Total Bilirubin: 0.6 mg/dL (ref 0.3–1.2)

## 2012-09-11 LAB — CBC WITH DIFFERENTIAL/PLATELET
Basophils Absolute: 0 10*3/uL (ref 0.0–0.1)
HCT: 33.9 % — ABNORMAL LOW (ref 39.0–52.0)
Lymphocytes Relative: 53 % — ABNORMAL HIGH (ref 12–46)
Lymphs Abs: 2.9 10*3/uL (ref 0.7–4.0)
Neutro Abs: 2 10*3/uL (ref 1.7–7.7)
Platelets: 81 10*3/uL — ABNORMAL LOW (ref 150–400)
RBC: 3.33 MIL/uL — ABNORMAL LOW (ref 4.22–5.81)
RDW: 14.7 % (ref 11.5–15.5)
WBC: 5.5 10*3/uL (ref 4.0–10.5)

## 2012-09-11 MED ORDER — TRAMADOL HCL 50 MG PO TABS: 50.0000 mg | ORAL_TABLET | Freq: Four times a day (QID) | ORAL | Status: DC | PRN

## 2012-09-11 NOTE — ED Notes (Addendum)
C/o "knot" to RUQ x 3-4 months that is gradually getting worse.  Pt states he was seen in January 2013 for same symptoms and had an infection in colon.  Reports diarrhea.  Denies nausea and vomiting.

## 2012-09-11 NOTE — ED Notes (Signed)
Patient presents with c/o pain to the right side at the ribs.  Enlarged nodule noted to the right side at the ribs  Stated he has lost a lot of weight since he has Hep C.

## 2012-09-11 NOTE — ED Notes (Signed)
Pt unable to void at this time.  Has been provided a cup with instructions.

## 2012-09-11 NOTE — ED Provider Notes (Signed)
History     CSN: [id number]  Arrival date & time 09/11/12  1919   First MD Initiated Contact with Patient 09/11/12 2135      Chief Complaint  Patient presents with  . Abdominal Pain    (Consider location/radiation/quality/duration/timing/severity/associated sxs/prior treatment) HPI Comments: 57 year old male with a history of hepatitis C who presents with a complaint of right rib pain and swelling. According to the CT scan that he had a couple of years ago he had hypodense lesions in his bones which they thought could be related to metastatic disease however he had a bone scan shortly thereafter showing no signs of bony uptake. He states that over the last couple of months he has had recurrent pain and swelling in his right anterior chest wall, costal margin, midclavicular line which is tender during the day but is not seem to bother him at night. He drinks heavy amounts of alcohol, states he has been losing some weight but overall has been doing fairly well other than his pain. He denies vomiting, diarrhea, swelling or rashes. The symptoms are gradually getting worse, worse with palpation, not associated with fever redness or purulent drainage.  Patient is a 57 y.o. male presenting with abdominal pain. The history is provided by the patient.  Abdominal Pain Associated symptoms include abdominal pain.    Past Medical History  Diagnosis Date  . Hepatitis C   . Glaucoma   . Cataract   . Hearing deficit     History reviewed. No pertinent past surgical history.  No family history on file.  History  Substance Use Topics  . Smoking status: Current Every Day Smoker  . Smokeless tobacco: Not on file  . Alcohol Use: Yes      Review of Systems  Gastrointestinal: Positive for abdominal pain.  All other systems reviewed and are negative.    Allergies  Review of patient's allergies indicates no known allergies.  Home Medications   Current Outpatient Rx  Name  Route  Sig   Dispense  Refill  . traMADol (ULTRAM) 50 MG tablet   Oral   Take 1 tablet (50 mg total) by mouth every 6 (six) hours as needed for pain.   15 tablet   0     BP 109/76  Pulse 82  Temp(Src) 98.2 F (36.8 C) (Oral)  Resp 16  SpO2 98%  Physical Exam  Nursing note and vitals reviewed. Constitutional: He appears well-developed and well-nourished. No distress.  HENT:  Head: Normocephalic and atraumatic.  Mouth/Throat: Oropharynx is clear and moist. No oropharyngeal exudate.  Eyes: Conjunctivae and EOM are normal. Pupils are equal, round, and reactive to light. Right eye exhibits no discharge. Left eye exhibits no discharge. No scleral icterus.  Neck: Normal range of motion. Neck supple. No JVD present. No thyromegaly present.  Cardiovascular: Normal rate, regular rhythm, normal heart sounds and intact distal pulses.  Exam reveals no gallop and no friction rub.   No murmur heard. Pulmonary/Chest: Effort normal and breath sounds normal. No respiratory distress. He has no wheezes. He has no rales. He exhibits tenderness ( Tenderness to palpation over the chest wall on the right, this is mild diffuse but has focal tenderness over the right lower anterior ribs near the costal margin where there appears to be a bony lump. Signs of trauma or crepitance, no redness, no swelling).  Abdominal: Soft. Bowel sounds are normal. He exhibits no distension and no mass. There is no tenderness.  Musculoskeletal: Normal range of motion.  He exhibits no edema and no tenderness.  Lymphadenopathy:    He has no cervical adenopathy.  Neurological: He is alert. Coordination normal.  Skin: Skin is warm and dry. No rash noted. No erythema.  Psychiatric: He has a normal mood and affect. His behavior is normal.    ED Course  Procedures (including critical care time)  Labs Reviewed  CBC WITH DIFFERENTIAL - Abnormal; Notable for the following:    RBC 3.33 (*)    Hemoglobin 11.8 (*)    HCT 33.9 (*)    MCV 101.8  (*)    MCH 35.4 (*)    Platelets 81 (*)    Neutrophils Relative % 36 (*)    Lymphocytes Relative 53 (*)    All other components within normal limits  COMPREHENSIVE METABOLIC PANEL - Abnormal; Notable for the following:    BUN 24 (*)    Creatinine, Ser 1.90 (*)    Albumin 3.2 (*)    AST 215 (*)    ALT 132 (*)    Alkaline Phosphatase 186 (*)    GFR calc non Af Amer 38 (*)    GFR calc Af Amer 44 (*)    All other components within normal limits  LIPASE, BLOOD - Abnormal; Notable for the following:    Lipase 82 (*)    All other components within normal limits  URINALYSIS, ROUTINE W REFLEX MICROSCOPIC   Dg Ribs Unilateral W/chest Right  09/11/2012   *RADIOLOGY REPORT*  Clinical Data: Right-sided chest pain and painful palpable abnormality.  RIGHT RIBS AND CHEST - 3+ VIEW  Comparison: Chest x-ray on 01/11/2011.  Findings: Prior chest radiograph shows no evidence of pulmonary infiltrate, edema or pleural fluid.  The heart size and mediastinal contours are within normal limits.  Right-sided rib films show some subtle calcification adjacent to and immediately inferior to the midportion of the right eleventh rib.  This is the region of palpable abnormality based on a marker placed on the patient's skin.  This is not the typical appearance of a fracture or healing fracture.  Subtle underlying soft tissue mass or bony lesion cannot be excluded by x-ray.  Recommend correlation with CT of the chest. The other visualized ribs and bony thorax show no focal abnormalities.  IMPRESSION: Subtle irregularity of the right eleventh rib with adjacent calcification.  This is not the typical appearance of a fracture and some other type of process such as a mass cannot be excluded. Recommend correlation with CT of the chest.   Original Report Authenticated By: Irish Lack, M.D.     1. Rib lesion   2. Renal insufficiency   3. Thrombocytopenia   4. Transaminitis       MDM  Eval for source of swelling, there  is no leukocytosis, mild anemia, macrocytic with a high MCV of 101, thrombocytopenic with platelet level of 81 which is slightly lower than it has been over the last 2 years where its lowest measured value prior to today was 112., transaminitis and renal failure, renal failure is slightly worse than it usually is, baseline has been as high as 1.62 years ago and today it is 1.9. The transaminitis is also new from 2 years ago with LFTs in the 130-210 range. Albumin is 3.2.  Pt has multiple abnormal findings on lab work, none of which appear critical.  I have informed him of all these findings and he agrees to follow up this week.  He has expressed his understanding.  Meds given in  ED:  Medications - No data to display  New Prescriptions   TRAMADOL (ULTRAM) 50 MG TABLET    Take 1 tablet (50 mg total) by mouth every 6 (six) hours as needed for pain.            Vida Roller, MD 09/11/12 [phone]

## 2012-09-12 NOTE — ED Notes (Signed)
Patient instructed to follow up with MD.  Stated understanding

## 2012-10-19 ENCOUNTER — Encounter: Payer: Self-pay | Admitting: Internal Medicine

## 2012-10-31 ENCOUNTER — Other Ambulatory Visit: Payer: Self-pay | Admitting: Family Medicine

## 2012-10-31 DIAGNOSIS — M7989 Other specified soft tissue disorders: Secondary | ICD-10-CM

## 2012-11-07 ENCOUNTER — Ambulatory Visit
Admission: RE | Admit: 2012-11-07 | Discharge: 2012-11-07 | Disposition: A | Discharge status: Home / Self Care | Payer: Medicaid Other | Source: Ambulatory Visit | Attending: Family Medicine | Admitting: Family Medicine

## 2012-11-07 ENCOUNTER — Other Ambulatory Visit: Payer: Self-pay | Admitting: Family Medicine

## 2012-11-07 ENCOUNTER — Telehealth: Payer: Self-pay | Admitting: Radiology

## 2012-11-07 DIAGNOSIS — M7989 Other specified soft tissue disorders: Secondary | ICD-10-CM

## 2012-11-07 NOTE — Telephone Encounter (Signed)
Please call pt - the [place] looked ok on his ribs.  I would recommend that the patient make an appt with Dr Milus Glazier to discuss next step.

## 2012-11-07 NOTE — Telephone Encounter (Signed)
*  RADIOLOGY REPORT*  Clinical Data: Probable area in the right inferior chest  LIMITED ABDOMINAL ULTRASOUND  Comparison: None.  Findings: Ultrasound over the area in question was performed. No  solid or cystic abnormality is seen. On palpation this area may  represent a prominent anterior right lower rib and could be due to  callus from prior fracture. Consider right lower rib detail films  if further assessment is warranted clinically.  IMPRESSION:  No solid or cystic abnormality is noted. Consider right lower  anterior rib detail films if warranted.  Original Report Authenticated By: Dwyane Dee, M.D.    Call report from [facility].

## 2012-11-08 NOTE — Telephone Encounter (Signed)
Called patient to advise. He indicates he will call me right back, he is in a Dr office currently

## 2012-11-08 NOTE — Telephone Encounter (Signed)
Patient advised, ultrasound looks okay. He will follow up with Dr Milus Glazier if pain continues, he has not had any improvement yet.

## 2013-03-27 ENCOUNTER — Other Ambulatory Visit: Payer: Medicaid Other

## 2013-04-19 NOTE — Progress Notes (Signed)
Received [facility] fax for add'l clinical information (didn't specify what type of scan). We haven't seen pt since Aug/no pending scans ordered. Called [facility] and pt has seen Dr L there more recently. I am re-faxing letter to them.

## 2013-06-15 ENCOUNTER — Other Ambulatory Visit: Payer: Self-pay | Admitting: Nurse Practitioner

## 2013-06-15 DIAGNOSIS — C220 Liver cell carcinoma: Secondary | ICD-10-CM

## 2013-06-20 ENCOUNTER — Ambulatory Visit
Admission: RE | Admit: 2013-06-20 | Discharge: 2013-06-20 | Disposition: A | Discharge status: Home / Self Care | Payer: Medicaid Other | Source: Ambulatory Visit | Attending: Nurse Practitioner | Admitting: Nurse Practitioner

## 2013-06-20 DIAGNOSIS — C220 Liver cell carcinoma: Secondary | ICD-10-CM

## 2013-07-28 ENCOUNTER — Encounter (HOSPITAL_COMMUNITY): Payer: Self-pay | Admitting: Emergency Medicine

## 2013-07-28 ENCOUNTER — Emergency Department (HOSPITAL_COMMUNITY)
Admission: EM | Admit: 2013-07-28 | Discharge: 2013-07-29 | Discharge status: Against Medical Advice | Payer: Medicaid Other | Attending: Emergency Medicine | Admitting: Emergency Medicine

## 2013-07-28 DIAGNOSIS — S0003XA Contusion of scalp, initial encounter: Secondary | ICD-10-CM | POA: Insufficient documentation

## 2013-07-28 DIAGNOSIS — S1093XA Contusion of unspecified part of neck, initial encounter: Principal | ICD-10-CM

## 2013-07-28 DIAGNOSIS — F17200 Nicotine dependence, unspecified, uncomplicated: Secondary | ICD-10-CM | POA: Insufficient documentation

## 2013-07-28 DIAGNOSIS — S0180XA Unspecified open wound of other part of head, initial encounter: Secondary | ICD-10-CM | POA: Insufficient documentation

## 2013-07-28 DIAGNOSIS — S0083XA Contusion of other part of head, initial encounter: Principal | ICD-10-CM | POA: Insufficient documentation
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
## 2016-06-17 ENCOUNTER — Encounter (HOSPITAL_COMMUNITY): Payer: Self-pay | Admitting: *Deleted

## 2016-06-17 ENCOUNTER — Emergency Department (HOSPITAL_COMMUNITY): Payer: Medicaid Other

## 2016-06-17 ENCOUNTER — Emergency Department (HOSPITAL_COMMUNITY)
Admission: EM | Admit: 2016-06-17 | Discharge: 2016-06-17 | Disposition: A | Discharge status: Home / Self Care | Payer: Medicaid Other | Attending: Emergency Medicine | Admitting: Emergency Medicine

## 2016-06-17 DIAGNOSIS — W540XXA Bitten by dog, initial encounter: Secondary | ICD-10-CM | POA: Diagnosis not present

## 2016-06-17 DIAGNOSIS — Z2914 Encounter for prophylactic rabies immune globin: Secondary | ICD-10-CM | POA: Diagnosis not present

## 2016-06-17 DIAGNOSIS — Z203 Contact with and (suspected) exposure to rabies: Secondary | ICD-10-CM | POA: Insufficient documentation

## 2016-06-17 DIAGNOSIS — Z23 Encounter for immunization: Secondary | ICD-10-CM

## 2016-06-17 DIAGNOSIS — Y999 Unspecified external cause status: Secondary | ICD-10-CM | POA: Insufficient documentation

## 2016-06-17 DIAGNOSIS — Y9301 Activity, walking, marching and hiking: Secondary | ICD-10-CM | POA: Diagnosis not present

## 2016-06-17 DIAGNOSIS — S81811A Laceration without foreign body, right lower leg, initial encounter: Secondary | ICD-10-CM | POA: Insufficient documentation

## 2016-06-17 DIAGNOSIS — F17200 Nicotine dependence, unspecified, uncomplicated: Secondary | ICD-10-CM | POA: Insufficient documentation

## 2016-06-17 DIAGNOSIS — Y92410 Unspecified street and highway as the place of occurrence of the external cause: Secondary | ICD-10-CM | POA: Insufficient documentation

## 2016-06-17 DIAGNOSIS — S81851A Open bite, right lower leg, initial encounter: Secondary | ICD-10-CM | POA: Diagnosis present

## 2016-06-17 HISTORY — DX: Disorder of kidney and ureter, unspecified: N28.9

## 2016-06-17 MED ORDER — AMOXICILLIN-POT CLAVULANATE 875-125 MG PO TABS: 1.0000 | ORAL_TABLET | Freq: Two times a day (BID) | ORAL | 0 refills | Status: DC

## 2016-06-17 MED ORDER — RABIES VACCINE, PCEC IM SUSR
1.0000 mL | Freq: Once | INTRAMUSCULAR | Status: AC
  Administered 2016-06-17: 1 mL via INTRAMUSCULAR
  Filled 2016-06-17: qty 1

## 2016-06-17 MED ORDER — RABIES IMMUNE GLOBULIN 150 UNIT/ML IM INJ
20.0000 [IU]/kg | INJECTION | Freq: Once | INTRAMUSCULAR | Status: AC
  Administered 2016-06-17: 975 [IU] via INTRAMUSCULAR
  Filled 2016-06-17: qty 8

## 2016-06-17 NOTE — ED Notes (Signed)
Turkey sandwich given 

## 2016-06-17 NOTE — ED Notes (Signed)
Patient transported to x-ray. ?

## 2016-06-17 NOTE — Progress Notes (Signed)
Orthopedic Tech Progress Note Patient Details:  Jeffrey [name] October 12, 1955 [id number]  Ortho Devices Type of Ortho Device: Crutches Ortho Device/Splint Interventions: Application   Jeffrey [name] 06/17/2016, 11:26 AM

## 2016-06-17 NOTE — ED Triage Notes (Signed)
Patient states he was walking down the street yest and was biten by a pit bull dog. States he doesn't know who's dog it is. Lacerations to right lower leg.

## 2016-06-17 NOTE — ED Provider Notes (Signed)
[facility] DEPT Provider Note   CSN: [id number] Arrival date & time: 06/17/16  0745     History   Chief Complaint Chief Complaint  Patient presents with  . Animal Bite    HPI LOLA LOFARO is a 61 y.o. male.  Patient presents to the emergency department with chief complaint of animal bite. He states that he was bitten by a pitbull yesterday. He does not know where the dog lives. There is no way to contact the owner of the dog if it has any.  Patient states that his last tetanus shot is within the last 5 years. He states that his leg is painful to walk on. He denies any other associated symptoms.   The history is provided by the patient. No language interpreter was used.    Past Medical History:  Diagnosis Date  . Cataract   . Glaucoma   . Hearing deficit   . Hepatitis C   . Renal disorder     There are no active problems to display for this patient.   History reviewed. No pertinent surgical history.     Home Medications    Prior to Admission medications   Medication Sig Start Date End Date Taking? Authorizing Provider  traMADol (ULTRAM) 50 MG tablet Take 1 tablet (50 mg total) by mouth every 6 (six) hours as needed for pain. 09/11/12   Noemi Chapel, MD    Family History History reviewed. No pertinent family history.  Social History Social History  Substance Use Topics  . Smoking status: Current Every Day Smoker  . Smokeless tobacco: Never Used  . Alcohol use Yes     Allergies   Patient has no known allergies.   Review of Systems Review of Systems  All other systems reviewed and are negative.    Physical Exam Updated Vital Signs BP 135/97 (BP Location: Right Arm)   Pulse 92   Temp 98.2 F (36.8 C) (Oral)   Resp 18   Ht 5\' 8"  (1.727 m)   Wt 48.1 kg   SpO2 100%   BMI 16.12 kg/m   Physical Exam  Constitutional: He is oriented to person, place, and time. He appears well-developed and well-nourished.  HENT:  Head: Normocephalic and  atraumatic.  Eyes: Conjunctivae and EOM are normal. Pupils are equal, round, and reactive to light. Right eye exhibits no discharge. Left eye exhibits no discharge. No scleral icterus.  Neck: Normal range of motion. Neck supple. No JVD present.  Cardiovascular: Normal rate, regular rhythm and normal heart sounds.  Exam reveals no gallop and no friction rub.   No murmur heard. Pulmonary/Chest: Effort normal and breath sounds normal. No respiratory distress. He has no wheezes. He has no rales. He exhibits no tenderness.  Abdominal: Soft. He exhibits no distension and no mass. There is no tenderness. There is no rebound and no guarding.  Musculoskeletal: Normal range of motion. He exhibits no edema or tenderness.  Ambulates with antalgic gait  Neurological: He is alert and oriented to person, place, and time.  Skin: Skin is warm and dry.  (3)1 inch lacerations to the right lower extremity (lateral calf), no foreign body, bleeding controlled  Psychiatric: He has a normal mood and affect. His behavior is normal. Judgment and thought content normal.  Nursing note and vitals reviewed.    ED Treatments / Results  Labs (all labs ordered are listed, but only abnormal results are displayed) Labs Reviewed - No data to display  EKG  EKG Interpretation  None       Radiology No results found.  Procedures Procedures (including critical care time)  Medications Ordered in ED Medications  rabies vaccine (RABAVERT) injection 1 mL (not administered)  rabies immune globulin (HYPERAB) injection 975 Units (not administered)     Initial Impression / Assessment and Plan / ED Course  I have reviewed the triage vital signs and the nursing notes.  Pertinent labs & imaging results that were available during my care of the patient were reviewed by me and considered in my medical decision making (see chart for details).     Patient with dog bite to right lower extremity. Unfortunately, it is not  clear whether the dog is up-to-date on its vaccinations, and there is no way to locate the dog. Patient will be given rabies vaccine series. Patient will need antibiotics. Will provide basic wound care. No primary closure indicated based on length of time from the injury as well as mechanism. The wound should heal well by secondary intention.  X-ray not crossing in epic, but report was dictated to me. I see no clinical evidence of osteomyelitis. There is no foreign body. Plan for primary care follow-up. Patient will also need to return on day 3, 7, and 14 for rabies vaccine.  Final Clinical Impressions(s) / ED Diagnoses   Final diagnoses:  Dog bite, initial encounter  Need for rabies vaccination    New Prescriptions New Prescriptions   AMOXICILLIN-CLAVULANATE (AUGMENTIN) 875-125 MG TABLET    Take 1 tablet by mouth every 12 (twelve) hours.     Montine Circle, PA-C 06/17/16 [place], MD 06/17/16 1547

## 2016-06-20 ENCOUNTER — Ambulatory Visit (HOSPITAL_COMMUNITY)
Admission: EM | Admit: 2016-06-20 | Discharge: 2016-06-20 | Disposition: A | Discharge status: Home / Self Care | Payer: Medicaid Other | Attending: Internal Medicine | Admitting: Internal Medicine

## 2016-06-20 ENCOUNTER — Encounter (HOSPITAL_COMMUNITY): Payer: Self-pay | Admitting: Emergency Medicine

## 2016-06-20 DIAGNOSIS — Z203 Contact with and (suspected) exposure to rabies: Secondary | ICD-10-CM

## 2016-06-20 DIAGNOSIS — Z23 Encounter for immunization: Secondary | ICD-10-CM | POA: Diagnosis not present

## 2016-06-20 MED ORDER — RABIES VACCINE, PCEC IM SUSR
INTRAMUSCULAR | Status: AC
  Filled 2016-06-20: qty 1

## 2016-06-20 MED ORDER — RABIES VACCINE, PCEC IM SUSR
1.0000 mL | Freq: Once | INTRAMUSCULAR | Status: AC
  Administered 2016-06-20: 1 mL via INTRAMUSCULAR

## 2016-06-20 NOTE — ED Triage Notes (Signed)
Here for rabies vaccination day #3  Voices no new concerns... A&O x4... NAD

## 2016-06-20 NOTE — Discharge Instructions (Signed)
Please return on 06/24/16 for day #7

## 2016-06-23 ENCOUNTER — Ambulatory Visit (HOSPITAL_COMMUNITY)
Admission: EM | Admit: 2016-06-23 | Discharge: 2016-06-23 | Disposition: A | Discharge status: Home / Self Care | Payer: Medicaid Other | Attending: Family Medicine | Admitting: Family Medicine

## 2016-06-23 ENCOUNTER — Encounter (HOSPITAL_COMMUNITY): Payer: Self-pay | Admitting: Emergency Medicine

## 2016-06-23 DIAGNOSIS — Z203 Contact with and (suspected) exposure to rabies: Secondary | ICD-10-CM | POA: Diagnosis not present

## 2016-06-23 DIAGNOSIS — Z23 Encounter for immunization: Secondary | ICD-10-CM | POA: Diagnosis not present

## 2016-06-23 MED ORDER — RABIES VACCINE, PCEC IM SUSR
INTRAMUSCULAR | Status: AC
  Filled 2016-06-23: qty 1

## 2016-06-23 MED ORDER — RABIES VACCINE, PCEC IM SUSR
1.0000 mL | Freq: Once | INTRAMUSCULAR | Status: AC
  Administered 2016-06-23: 1 mL via INTRAMUSCULAR

## 2016-06-23 NOTE — ED Triage Notes (Signed)
The patient presented to the [facility] to receive his Day 7 rabies vaccine booster.

## 2016-06-23 NOTE — Discharge Instructions (Signed)
Please return to the Urgent Care Center on 07/01/2016 for your last rabies booster injection.

## 2016-07-01 ENCOUNTER — Encounter (HOSPITAL_COMMUNITY): Payer: Self-pay | Admitting: *Deleted

## 2016-07-01 ENCOUNTER — Ambulatory Visit (HOSPITAL_COMMUNITY)
Admission: EM | Admit: 2016-07-01 | Discharge: 2016-07-01 | Disposition: A | Discharge status: Home / Self Care | Payer: Medicaid Other | Attending: Internal Medicine | Admitting: Internal Medicine

## 2016-07-01 DIAGNOSIS — Z23 Encounter for immunization: Secondary | ICD-10-CM

## 2016-07-01 DIAGNOSIS — Z203 Contact with and (suspected) exposure to rabies: Secondary | ICD-10-CM | POA: Diagnosis not present

## 2016-07-01 MED ORDER — RABIES VACCINE, PCEC IM SUSR
INTRAMUSCULAR | Status: AC
  Filled 2016-07-01: qty 1

## 2016-07-01 MED ORDER — RABIES VACCINE, PCEC IM SUSR
1.0000 mL | Freq: Once | INTRAMUSCULAR | Status: AC
  Administered 2016-07-01: 1 mL via INTRAMUSCULAR

## 2016-07-01 NOTE — ED Triage Notes (Signed)
Patient here today for follow up rabies injection, last of series

## 2016-07-01 NOTE — ED Notes (Signed)
Talked with patient about bit to right lower leg, patient is till taking augmentin and states he is doing dressing changes at home. Instructions given to patient regarding home care use and how signs and symptoms to come back to urgent care to have evaluated. Patient states that area is still warm and sore.

## 2016-07-15 ENCOUNTER — Encounter (HOSPITAL_COMMUNITY): Payer: Self-pay | Admitting: Family Medicine

## 2016-07-15 ENCOUNTER — Ambulatory Visit (HOSPITAL_COMMUNITY)
Admission: EM | Admit: 2016-07-15 | Discharge: 2016-07-15 | Disposition: A | Discharge status: Home / Self Care | Payer: Medicaid Other | Attending: Family Medicine | Admitting: Family Medicine

## 2016-07-15 DIAGNOSIS — W540XXA Bitten by dog, initial encounter: Secondary | ICD-10-CM

## 2016-07-15 DIAGNOSIS — Z5189 Encounter for other specified aftercare: Secondary | ICD-10-CM

## 2016-07-15 DIAGNOSIS — L03116 Cellulitis of left lower limb: Secondary | ICD-10-CM

## 2016-07-15 MED ORDER — CEPHALEXIN 500 MG PO CAPS: 500.0000 mg | ORAL_CAPSULE | Freq: Two times a day (BID) | ORAL | 0 refills | Status: DC

## 2016-07-15 NOTE — ED Triage Notes (Signed)
Patient here for recheck of dog bite to right lower leg.  Dr Joseph Art evaluated patient

## 2016-07-15 NOTE — ED Provider Notes (Signed)
[facility]    CSN: [id number] Arrival date & time: 07/15/16  1047     History   Chief Complaint Chief Complaint  Patient presents with  . Wound Check    HPI Jeffrey [name] is a 61 y.o. male.   Is a 61 year old man who is been disabled because of hepatitis C. Is also losing his vision in his left arm.  3 weeks ago he was bit by dog on his right lower extremity. Was a pit bull. He was given rabies vaccine series and Augmentin. Over the last week he's felt more pain in the area of the dog bite.      Past Medical History:  Diagnosis Date  . Cataract   . Glaucoma   . Hearing deficit   . Hepatitis C   . Renal disorder     There are no active problems to display for this patient.   History reviewed. No pertinent surgical history.     Home Medications    Prior to Admission medications   Medication Sig Start Date End Date Taking? Authorizing Provider  cephALEXin (KEFLEX) 500 MG capsule Take 1 capsule (500 mg total) by mouth 2 (two) times daily. 07/15/16   Robyn Haber, MD    Family History No family history on file.  Social History Social History  Substance Use Topics  . Smoking status: Current Every Day Smoker  . Smokeless tobacco: Never Used  . Alcohol use Yes     Allergies   Patient has no known allergies.   Review of Systems Review of Systems  Constitutional: Negative.   Skin: Positive for wound.     Physical Exam Triage Vital Signs ED Triage Vitals  Enc Vitals Group     BP      Pulse      Resp      Temp      Temp src      SpO2      Weight      Height      Head Circumference      Peak Flow      Pain Score      Pain Loc      Pain Edu?      Excl. in [place]?    No data found.   Updated Vital Signs BP 127/83 (BP Location: Right Arm)   Pulse 74   Temp 98.3 F (36.8 C) (Oral)   Resp 18   SpO2 99%    Physical Exam  Constitutional: He is oriented to person, place, and time. He appears well-developed and  well-nourished.  HENT:  Right Ear: External ear normal.  Left Ear: External ear normal.  Mouth/Throat: Oropharynx is clear and moist.  Eyes: Conjunctivae and EOM are normal. Pupils are equal, round, and reactive to light.  Neck: Normal range of motion. Neck supple.  Pulmonary/Chest: Effort normal.  Musculoskeletal: Normal range of motion.  Neurological: He is alert and oriented to person, place, and time.  Skin: Skin is warm and dry. There is erythema.  Patient is tender in the lateral right lower leg tissues surrounding the healing dog bite which is covered by a dry eschar. He's also tender on the medial side of the right knee.  Nursing note and vitals reviewed.    UC Treatments / Results  Labs (all labs ordered are listed, but only abnormal results are displayed) Labs Reviewed - No data to display  EKG  EKG Interpretation None  Radiology No results found.  Procedures Procedures (including critical care time)  Medications Ordered in UC Medications - No data to display   Initial Impression / Assessment and Plan / UC Course  I have reviewed the triage vital signs and the nursing notes.  Pertinent labs & imaging results that were available during my care of the patient were reviewed by me and considered in my medical decision making (see chart for details).     Final Clinical Impressions(s) / UC Diagnoses   Final diagnoses:  Visit for wound check  Cellulitis of left lower extremity    New Prescriptions New Prescriptions   CEPHALEXIN (KEFLEX) 500 MG CAPSULE    Take 1 capsule (500 mg total) by mouth 2 (two) times daily.     Robyn Haber, MD 07/15/16 1137

## 2017-02-04 ENCOUNTER — Encounter (HOSPITAL_COMMUNITY): Payer: Self-pay | Admitting: Emergency Medicine

## 2017-02-04 ENCOUNTER — Emergency Department (HOSPITAL_COMMUNITY)
Admission: EM | Admit: 2017-02-04 | Discharge: 2017-02-05 | Disposition: A | Discharge status: Home / Self Care | Payer: Medicaid Other | Attending: Emergency Medicine | Admitting: Emergency Medicine

## 2017-02-04 ENCOUNTER — Emergency Department (HOSPITAL_COMMUNITY): Payer: Medicaid Other

## 2017-02-04 DIAGNOSIS — F17200 Nicotine dependence, unspecified, uncomplicated: Secondary | ICD-10-CM

## 2017-02-04 DIAGNOSIS — J189 Pneumonia, unspecified organism: Secondary | ICD-10-CM

## 2017-02-04 DIAGNOSIS — R509 Fever, unspecified: Secondary | ICD-10-CM | POA: Diagnosis present

## 2017-02-04 LAB — CBC WITH DIFFERENTIAL/PLATELET
Basophils Absolute: 0 10*3/uL (ref 0.0–0.1)
Basophils Relative: 1 %
EOS ABS: 0 10*3/uL (ref 0.0–0.7)
Eosinophils Relative: 1 %
HEMATOCRIT: 35.8 % — AB (ref 39.0–52.0)
HEMOGLOBIN: 11.8 g/dL — AB (ref 13.0–17.0)
LYMPHS ABS: 1.6 10*3/uL (ref 0.7–4.0)
LYMPHS PCT: 48 %
MCH: 35 pg — AB (ref 26.0–34.0)
MCHC: 33 g/dL (ref 30.0–36.0)
MCV: 106.2 fL — AB (ref 78.0–100.0)
MONOS PCT: 14 %
Monocytes Absolute: 0.5 10*3/uL (ref 0.1–1.0)
NEUTROS ABS: 1.2 10*3/uL — AB (ref 1.7–7.7)
NEUTROS PCT: 37 %
Platelets: 53 10*3/uL — ABNORMAL LOW (ref 150–400)
RBC: 3.37 MIL/uL — AB (ref 4.22–5.81)
RDW: 13.5 % (ref 11.5–15.5)
WBC: 3.3 10*3/uL — AB (ref 4.0–10.5)

## 2017-02-04 LAB — URINALYSIS, ROUTINE W REFLEX MICROSCOPIC
BACTERIA UA: NONE SEEN
BILIRUBIN URINE: NEGATIVE
Glucose, UA: NEGATIVE mg/dL
Ketones, ur: NEGATIVE mg/dL
LEUKOCYTES UA: NEGATIVE
NITRITE: NEGATIVE
Protein, ur: NEGATIVE mg/dL
SPECIFIC GRAVITY, URINE: 1.014 (ref 1.005–1.030)
Squamous Epithelial / LPF: NONE SEEN
WBC, UA: NONE SEEN WBC/hpf (ref 0–5)
pH: 5 (ref 5.0–8.0)

## 2017-02-04 LAB — BASIC METABOLIC PANEL
Anion gap: 9 (ref 5–15)
BUN: 23 mg/dL — AB (ref 6–20)
CHLORIDE: 99 mmol/L — AB (ref 101–111)
CO2: 24 mmol/L (ref 22–32)
Calcium: 8.7 mg/dL — ABNORMAL LOW (ref 8.9–10.3)
Creatinine, Ser: 1.94 mg/dL — ABNORMAL HIGH (ref 0.61–1.24)
GFR calc Af Amer: 42 mL/min — ABNORMAL LOW (ref >60)
GFR calc non Af Amer: 36 mL/min — ABNORMAL LOW (ref >60)
Glucose, Bld: 88 mg/dL (ref 65–99)
POTASSIUM: 4.3 mmol/L (ref 3.5–5.1)
SODIUM: 132 mmol/L — AB (ref 135–145)

## 2017-02-04 LAB — I-STAT CG4 LACTIC ACID, ED
LACTIC ACID, VENOUS: 2.07 mmol/L — AB (ref 0.5–1.9)
LACTIC ACID, VENOUS: 0.92 mmol/L (ref 0.5–1.9)

## 2017-02-04 MED ORDER — ALBUTEROL SULFATE HFA 108 (90 BASE) MCG/ACT IN AERS
2.0000 | INHALATION_SPRAY | RESPIRATORY_TRACT | Status: DC | PRN
  Administered 2017-02-04: 2 via RESPIRATORY_TRACT
  Filled 2017-02-04: qty 6.7

## 2017-02-04 MED ORDER — OXYCODONE-ACETAMINOPHEN 5-325 MG PO TABS
1.0000 | ORAL_TABLET | Freq: Once | ORAL | Status: AC
  Administered 2017-02-04: 1 via ORAL
  Filled 2017-02-04: qty 1

## 2017-02-04 MED ORDER — DEXTROSE 5 % IV SOLN
1.0000 g | Freq: Once | INTRAVENOUS | Status: AC
  Administered 2017-02-04: 1 g via INTRAVENOUS
  Filled 2017-02-04: qty 10

## 2017-02-04 MED ORDER — DEXTROSE 5 % IV SOLN
500.0000 mg | Freq: Once | INTRAVENOUS | Status: AC
  Administered 2017-02-04: 500 mg via INTRAVENOUS
  Filled 2017-02-04: qty 500

## 2017-02-04 MED ORDER — PREDNISONE 20 MG PO TABS
20.0000 mg | ORAL_TABLET | Freq: Once | ORAL | Status: AC
  Administered 2017-02-04: 20 mg via ORAL
  Filled 2017-02-04: qty 1

## 2017-02-04 MED ORDER — SODIUM CHLORIDE 0.9 % IV BOLUS (SEPSIS)
500.0000 mL | Freq: Once | INTRAVENOUS | Status: AC
  Administered 2017-02-04: 500 mL via INTRAVENOUS

## 2017-02-04 MED ORDER — AZITHROMYCIN 250 MG PO TABS: ORAL_TABLET | ORAL | 0 refills | Status: DC

## 2017-02-04 MED ORDER — ACETAMINOPHEN 325 MG PO TABS
ORAL_TABLET | ORAL | Status: AC
  Filled 2017-02-04: qty 2

## 2017-02-04 MED ORDER — ACETAMINOPHEN 325 MG PO TABS
650.0000 mg | ORAL_TABLET | Freq: Once | ORAL | Status: AC
  Administered 2017-02-04: 650 mg via ORAL

## 2017-02-04 MED ORDER — PREDNISONE 10 MG PO TABS: 20.0000 mg | ORAL_TABLET | Freq: Every day | ORAL | 0 refills | Status: DC

## 2017-02-04 NOTE — Discharge Instructions (Signed)
Recheck with your doctor 1-2 weeks. Return if your are worse especially short of breath. Stop smoking. Use inhaler 2 puffs every four hours for cough

## 2017-02-04 NOTE — ED Triage Notes (Signed)
Pt states he is been having cold like symptoms for the past 2 weeks with generalized body ache and chills. States he is homeless and he is not getting better.

## 2017-02-04 NOTE — ED Notes (Signed)
ED Provider at bedside. 

## 2017-02-04 NOTE — ED Triage Notes (Signed)
Pt to ED via [facility] with c/o flu type symptoms,

## 2017-02-04 NOTE — ED Provider Notes (Signed)
[place] EMERGENCY DEPARTMENT Provider Note   CSN: [id number] Arrival date & time: 02/04/17  1834     History   Chief Complaint Chief Complaint  Patient presents with  . URI    HPI Jeffrey [name] is a 61 y.o. male.  HPI 61 year old man history of hep C, renal disorder, presents today complaining of fever, body chills, and nonproductive cough.  He states that he first became sick 2 weeks ago and had flulike symptoms.  He reports subjective fever but had not taken his temperature at home.  He does take [employer]. He denies dyspnea but states he has been nauseated and vomited several times.   Past Medical History:  Diagnosis Date  . Cataract   . Glaucoma   . Hearing deficit   . Hepatitis C   . Renal disorder     There are no active problems to display for this patient.   History reviewed. No pertinent surgical history.     Home Medications    Prior to Admission medications   Medication Sig Start Date End Date Taking? Authorizing Provider  cephALEXin (KEFLEX) 500 MG capsule Take 1 capsule (500 mg total) by mouth 2 (two) times daily. 07/15/16   Robyn Haber, MD    Family History No family history on file.  Social History Social History  Substance Use Topics  . Smoking status: Current Every Day Smoker  . Smokeless tobacco: Never Used  . Alcohol use Yes     Allergies   Patient has no known allergies.   Review of Systems Review of Systems  Constitutional: Positive for activity change, appetite change and fatigue.  HENT: Positive for congestion.   Eyes: Negative.   Respiratory: Positive for cough.   Cardiovascular: Negative.   Gastrointestinal: Positive for nausea and vomiting.  Endocrine: Negative.   Genitourinary: Negative.   Musculoskeletal: Negative.   Allergic/Immunologic: Negative.   Hematological: Negative.   Psychiatric/Behavioral: Negative.   All other systems reviewed and are negative.    Physical  Exam Updated Vital Signs BP (!) 142/87 (BP Location: Right Arm)   Pulse 96   Temp (!) 100.5 F (38.1 C) (Oral)   Resp 18   Ht 1.727 m (5\' 8" )   Wt 47.6 kg (105 lb)   SpO2 99%   BMI 15.97 kg/m   Physical Exam  Constitutional: He is oriented to person, place, and time. He appears well-developed and well-nourished.  HENT:  Head: Normocephalic and atraumatic.  Right Ear: External ear normal.  Left Ear: External ear normal.  Eyes: Pupils are equal, round, and reactive to light. Conjunctivae and EOM are normal.  Neck: Normal range of motion. Neck supple.  Cardiovascular: Normal rate and regular rhythm.   Pulmonary/Chest:  Some decreased bs and rales bilateral bases   Abdominal: Soft. Bowel sounds are normal.  Musculoskeletal: Normal range of motion.  Neurological: He is alert and oriented to person, place, and time.  Skin: Skin is warm. Capillary refill takes less than 2 seconds.  Psychiatric: He has a normal mood and affect.  Nursing note and vitals reviewed.    ED Treatments / Results  Labs (all labs ordered are listed, but only abnormal results are displayed) Labs Reviewed  CBC WITH DIFFERENTIAL/PLATELET - Abnormal; Notable for the following:       Result Value   WBC 3.3 (*)    RBC 3.37 (*)    Hemoglobin 11.8 (*)    HCT 35.8 (*)    MCV 106.2 (*)  MCH 35.0 (*)    Platelets 53 (*)    Neutro Abs 1.2 (*)    All other components within normal limits  BASIC METABOLIC PANEL - Abnormal; Notable for the following:    Sodium 132 (*)    Chloride 99 (*)    BUN 23 (*)    Creatinine, Ser 1.94 (*)    Calcium 8.7 (*)    GFR calc non Af Amer 36 (*)    GFR calc Af Amer 42 (*)    All other components within normal limits  URINALYSIS, ROUTINE W REFLEX MICROSCOPIC - Abnormal; Notable for the following:    Hgb urine dipstick SMALL (*)    All other components within normal limits  I-STAT CG4 LACTIC ACID, ED - Abnormal; Notable for the following:    Lactic Acid, Venous 2.07 (*)     All other components within normal limits  I-STAT CG4 LACTIC ACID, ED    EKG  EKG Interpretation None       Radiology Dg Chest 2 View  Result Date: 02/04/2017 CLINICAL DATA:  Initial evaluation for acute productive cough, fever, shortness of breath. EXAM: CHEST  2 VIEW COMPARISON:  Prior radiograph from 09/11/2012. FINDINGS: Cardiac and mediastinal silhouettes within normal limits. Aortic atherosclerosis. Few scattered patchy multifocal opacities noted within the bilateral lower lobes, suspicious for possible infiltrates given provided history. No pulmonary edema or pleural effusion. No pneumothorax. No acute osseous abnormality. IMPRESSION: 1. Scattered patchy multifocal opacities within the bilateral lung bases, suspicious for mild/developing infiltrates given provided history. 2. Aortic atherosclerosis. Electronically Signed   By: Jeannine Boga M.D.   On: 02/04/2017 20:18    Procedures Procedures (including critical care time)  Medications Ordered in ED Medications  acetaminophen (TYLENOL) 325 MG tablet (not administered)  acetaminophen (TYLENOL) tablet 650 mg (650 mg Oral Given 02/04/17 1934)     Initial Impression / Assessment and Plan / ED Course  I have reviewed the triage vital signs and the nursing notes.  Pertinent labs & imaging results that were available during my care of the patient were reviewed by me and considered in my medical decision making (see chart for details).    61 year old male presents today with cough for 2 weeks worsening over the past several days.  He has possible early bilateral lower lobe infiltrates.  He otherwise appears well with good oxygen saturations, normal heart rate, and taking p.o. without difficulty.  But he is a smoker.  He is advised regarding smoking cessation.  He is given Rocephin and Zithromax here with a prescription for Zithromax.  He is also given albuterol and low-dose prednisone.  We discussed return precautions and  need for close follow-up and he voices understanding  Final Clinical Impressions(s) / ED Diagnoses   Final diagnoses:  Community acquired pneumonia, unspecified laterality  Smoker    New Prescriptions New Prescriptions   No medications on file     Pattricia Boss, MD 02/06/17 [phone]

## 2017-02-05 NOTE — ED Notes (Signed)
Pt walked with cane, steady with a normal gait. Stated that he "felt drunk."

## 2017-05-25 ENCOUNTER — Emergency Department (HOSPITAL_BASED_OUTPATIENT_CLINIC_OR_DEPARTMENT_OTHER)
Admit: 2017-05-25 | Discharge: 2017-05-25 | Disposition: A | Discharge status: Home / Self Care | Payer: Medicaid Other | Attending: Student | Admitting: Student

## 2017-05-25 ENCOUNTER — Emergency Department (HOSPITAL_COMMUNITY)
Admission: EM | Admit: 2017-05-25 | Discharge: 2017-05-25 | Disposition: A | Discharge status: Home / Self Care | Payer: Medicaid Other | Attending: Emergency Medicine | Admitting: Emergency Medicine

## 2017-05-25 ENCOUNTER — Emergency Department (HOSPITAL_COMMUNITY): Payer: Medicaid Other

## 2017-05-25 DIAGNOSIS — M7041 Prepatellar bursitis, right knee: Secondary | ICD-10-CM

## 2017-05-25 DIAGNOSIS — Y9389 Activity, other specified: Secondary | ICD-10-CM | POA: Insufficient documentation

## 2017-05-25 DIAGNOSIS — F17200 Nicotine dependence, unspecified, uncomplicated: Secondary | ICD-10-CM | POA: Diagnosis not present

## 2017-05-25 DIAGNOSIS — R030 Elevated blood-pressure reading, without diagnosis of hypertension: Secondary | ICD-10-CM | POA: Insufficient documentation

## 2017-05-25 DIAGNOSIS — M7989 Other specified soft tissue disorders: Secondary | ICD-10-CM | POA: Diagnosis not present

## 2017-05-25 DIAGNOSIS — M79609 Pain in unspecified limb: Secondary | ICD-10-CM | POA: Diagnosis not present

## 2017-05-25 DIAGNOSIS — M25561 Pain in right knee: Secondary | ICD-10-CM | POA: Diagnosis present

## 2017-05-25 DIAGNOSIS — R2241 Localized swelling, mass and lump, right lower limb: Secondary | ICD-10-CM | POA: Diagnosis not present

## 2017-05-25 LAB — CBC WITH DIFFERENTIAL/PLATELET
Basophils Absolute: 0 10*3/uL (ref 0.0–0.1)
Basophils Relative: 0 %
EOS ABS: 0 10*3/uL (ref 0.0–0.7)
Eosinophils Relative: 0 %
HCT: 35.6 % — ABNORMAL LOW (ref 39.0–52.0)
HEMOGLOBIN: 12 g/dL — AB (ref 13.0–17.0)
LYMPHS ABS: 1.3 10*3/uL (ref 0.7–4.0)
LYMPHS PCT: 19 %
MCH: 35 pg — AB (ref 26.0–34.0)
MCHC: 33.7 g/dL (ref 30.0–36.0)
MCV: 103.8 fL — AB (ref 78.0–100.0)
MONOS PCT: 7 %
Monocytes Absolute: 0.5 10*3/uL (ref 0.1–1.0)
NEUTROS PCT: 74 %
Neutro Abs: 4.9 10*3/uL (ref 1.7–7.7)
Platelets: 69 10*3/uL — ABNORMAL LOW (ref 150–400)
RBC: 3.43 MIL/uL — ABNORMAL LOW (ref 4.22–5.81)
RDW: 14.2 % (ref 11.5–15.5)
WBC: 6.7 10*3/uL (ref 4.0–10.5)

## 2017-05-25 LAB — BASIC METABOLIC PANEL
Anion gap: 12 (ref 5–15)
BUN: 18 mg/dL (ref 6–20)
CHLORIDE: 101 mmol/L (ref 101–111)
CO2: 21 mmol/L — AB (ref 22–32)
CREATININE: 1.46 mg/dL — AB (ref 0.61–1.24)
Calcium: 9.1 mg/dL (ref 8.9–10.3)
GFR calc Af Amer: 59 mL/min — ABNORMAL LOW (ref >60)
GFR calc non Af Amer: 51 mL/min — ABNORMAL LOW (ref >60)
GLUCOSE: 151 mg/dL — AB (ref 65–99)
Potassium: 3.4 mmol/L — ABNORMAL LOW (ref 3.5–5.1)
SODIUM: 134 mmol/L — AB (ref 135–145)

## 2017-05-25 MED ORDER — OXYCODONE HCL 5 MG PO TABS
5.0000 mg | ORAL_TABLET | Freq: Once | ORAL | Status: AC
  Administered 2017-05-25: 5 mg via ORAL
  Filled 2017-05-25: qty 1

## 2017-05-25 MED ORDER — OXYCODONE-ACETAMINOPHEN 5-325 MG PO TABS
1.0000 | ORAL_TABLET | Freq: Once | ORAL | Status: DC
  Filled 2017-05-25: qty 1

## 2017-05-25 MED ORDER — NAPROXEN 500 MG PO TABS: 500.0000 mg | ORAL_TABLET | Freq: Every day | ORAL | 0 refills | Status: DC

## 2017-05-25 NOTE — ED Triage Notes (Signed)
Pt states he was bit by a dog in January on his right toe, right toe is swollen and red but pt is c/o of pain into lower right leg.

## 2017-05-25 NOTE — Progress Notes (Signed)
RLE venous duplex prelim: limited study as patient would not remove long johns for study, unable to image right groin/ CFV. No evidence of DVT in visualized veins. Landry Mellow, RDMS, RVT

## 2017-05-25 NOTE — ED Provider Notes (Signed)
[place] [facility] EMERGENCY DEPARTMENT Provider Note   CSN: [id number] Arrival date & time: 05/25/17  4332     History   Chief Complaint Chief Complaint  Patient presents with  . Leg Pain    HPI Jeffrey [name] is a 62 y.o. male.  Patient with history of hep C -- presents with complaint of worsening right knee pain over the past 3 days.  Patient complains of swelling of the knee.  He has had previous dog bite to the lower leg, chart shows this was April 2018.  He does not have any swelling associated with that.  He states that he is having difficulty walking because of the pain.  No fevers, nausea, vomiting, diarrhea.  No numbness or tingling of the foot or lower leg. The onset of this condition was acute. The course is worsening. Aggravating factors: movement and palpation. Alleviating factors: none.  No treatments prior to arrival.      Past Medical History:  Diagnosis Date  . Cataract   . Glaucoma   . Hearing deficit   . Hepatitis C   . Renal disorder     There are no active problems to display for this patient.   No past surgical history on file.     Home Medications    Prior to Admission medications   Medication Sig Start Date End Date Taking? Authorizing Provider  azithromycin (ZITHROMAX Z-PAK) 250 MG tablet One po qday x four days- Patient not taking: Reported on 05/25/2017 02/04/17   Pattricia Boss, MD  cephALEXin (KEFLEX) 500 MG capsule Take 1 capsule (500 mg total) by mouth 2 (two) times daily. Patient not taking: Reported on 05/25/2017 07/15/16   Robyn Haber, MD  predniSONE (DELTASONE) 10 MG tablet Take 2 tablets (20 mg total) by mouth daily. Patient not taking: Reported on 05/25/2017 02/04/17   Pattricia Boss, MD    Family History No family history on file.  Social History Social History   Tobacco Use  . Smoking status: Current Every Day Smoker  . Smokeless tobacco: Never Used  Substance Use Topics  . Alcohol use: Yes  . Drug use:  No     Allergies   Patient has no known allergies.   Review of Systems Review of Systems  Constitutional: Negative for activity change.  Gastrointestinal: Negative for diarrhea, nausea and vomiting.  Musculoskeletal: Positive for arthralgias, gait problem and joint swelling. Negative for back pain and neck pain.  Skin: Negative for wound.  Neurological: Negative for weakness and numbness.     Physical Exam Updated Vital Signs BP (!) 182/82   Pulse 79   Temp 98.9 F (37.2 C) (Oral)   Resp 15   SpO2 98%   Physical Exam  Constitutional: He appears well-developed and well-nourished.  HENT:  Head: Normocephalic and atraumatic.  Eyes: Conjunctivae are normal.  Neck: Normal range of motion. Neck supple.  Cardiovascular: Normal pulses. Exam reveals no decreased pulses.  Pulses:      Dorsalis pedis pulses are 2+ on the right side, and 2+ on the left side.  Musculoskeletal: He exhibits tenderness. He exhibits no edema.       Right knee: He exhibits swelling and effusion. He exhibits normal range of motion. No tenderness found.       Legs: Neurological: He is alert. No sensory deficit.  Motor, sensation, and vascular distal to the injury is fully intact.   Skin: Skin is warm and dry.  Psychiatric: He has a normal mood and  affect.  Nursing note and vitals reviewed.    ED Treatments / Results  Labs (all labs ordered are listed, but only abnormal results are displayed) Labs Reviewed  CBC WITH DIFFERENTIAL/PLATELET - Abnormal; Notable for the following components:      Result Value   RBC 3.43 (*)    Hemoglobin 12.0 (*)    HCT 35.6 (*)    MCV 103.8 (*)    MCH 35.0 (*)    All other components within normal limits  BASIC METABOLIC PANEL - Abnormal; Notable for the following components:   Sodium 134 (*)    Potassium 3.4 (*)    CO2 21 (*)    Glucose, Bld 151 (*)    Creatinine, Ser 1.46 (*)    GFR calc non Af Amer 51 (*)    GFR calc Af Amer 59 (*)    All other components  within normal limits    EKG  EKG Interpretation None       Radiology Dg Knee Complete 4 Views Right  Result Date: 05/25/2017 CLINICAL DATA:  Right-sided knee pain and swelling. EXAM: RIGHT KNEE - COMPLETE 4+ VIEW COMPARISON:  None. FINDINGS: No evidence of fracture, or dislocation. There is a small suprapatellar joint effusion. Marked supra and prepatellar soft tissue swelling. IMPRESSION: No acute fracture or dislocation identified about the right knee. Small suprapatellar joint effusion. Marked supra and prepatellar soft tissue swelling. Electronically Signed   By: Fidela Salisbury M.D.   On: 05/25/2017 18:02   Dg Foot Complete Right  Result Date: 05/25/2017 CLINICAL DATA:  Right foot and lower leg swelling and pain. No recent injury EXAM: RIGHT FOOT COMPLETE - 3+ VIEW COMPARISON:  None FINDINGS: There is no evidence of fracture or dislocation. There is no evidence of arthropathy or other focal bone abnormality. Soft tissues are unremarkable. Small plantar heel spur. IMPRESSION: 1. No focal bone abnormalities identified. 2. Heel spur. Electronically Signed   By: Kerby Moors M.D.   On: 05/25/2017 10:30   Vas [place] Lower Extremity Venous (dvt) (only [facility])  Result Date: 05/25/2017  Lower Venous Study Indication: Swelling, and Pain. Examination Guidelines: A complete evaluation includes B-mode imaging, spectral doppler, color doppler, and power doppler as needed of all accessible portions of each vessel. Bilateral testing is considered an integral part of a complete examination. Limited examinations for reoccurring indications may be performed as noted. The reflux portion of the exam is performed with the patient in reverse Trendelenburg.  Right Venous Findings: +---------+---------------+---------+-----------+----------+-------+          CompressibilityPhasicitySpontaneityPropertiesSummary +---------+---------------+---------+-----------+----------+-------+ FV Prox  Full            Yes      Yes                          +---------+---------------+---------+-----------+----------+-------+ FV Mid   Full                                                 +---------+---------------+---------+-----------+----------+-------+ FV DistalFull                                                 +---------+---------------+---------+-----------+----------+-------+ PFV  Full                                                 +---------+---------------+---------+-----------+----------+-------+ POP                     Yes      Yes                          +---------+---------------+---------+-----------+----------+-------+ PTV      Full                                                 +---------+---------------+---------+-----------+----------+-------+ PERO     Full                                                 +---------+---------------+---------+-----------+----------+-------+ Unable to image right CFV as patient would not remove his clothing.    Final Interpretation: Right: There is no evidence of deep vein thrombosis in the lower extremity. However, portions of this examination were limited- see technologist comments above. No cystic structure found in the popliteal fossa.  *See table(s) above for measurements and observations. Electronically signed by Deitra Mayo on 05/25/2017 at 5:00:06 PM.   Final    Procedures Procedures (including critical care time)  Medications Ordered in ED Medications  oxyCODONE (Oxy IR/ROXICODONE) immediate release tablet 5 mg (not administered)  oxyCODONE (Oxy IR/ROXICODONE) immediate release tablet 5 mg (5 mg Oral Given 05/25/17 1522)     Initial Impression / Assessment and Plan / ED Course  I have reviewed the triage vital signs and the nursing notes.  Pertinent labs & imaging results that were available during my care of the patient were reviewed by me and considered in my medical decision making (see chart for  details).     Patient seen and examined. Work-up initiated. Medications ordered.  Doppler ordered by previous team pending.  I do not see any significant effusion, although patient does not give any attempt to bend his knee because of pain and does not seem very motivated.  No fever. Presentation is most consistent with prepatellar bursitis.  Lower concern for septic arthritis.  Will treat pain, check labs, attempt ambulation.  Vital signs reviewed and are as follows: BP (!) 182/82   Pulse 79   Temp 98.9 F (37.2 C) (Oral)   Resp 15   SpO2 98%   4:25 PM Labs reviewed and are at baseline. Patient had x-ray of foot on arrival -- needs one of his knee. Ordered. Will need ambulation trial and likely d/c to home. Pt has been seen by Dr. Laverta Baltimore who agrees with plan.   6:35 PM X-ray of knee shows swelling as expected given exam.   Pain was treated here.  Patient was given crutches to use.  We will discharged home with naproxen and PCP follow-up.  Counseled on rice protocol.  Final Clinical Impressions(s) / ED Diagnoses   Final diagnoses:  Prepatellar bursitis of right knee  Elevated blood pressure reading   Patient presents with signs and symptoms consistent  with prepatellar bursitis of the right knee.  Patient does not have fevers, drainage, elevated white count to suggest septic bursitis.  He does not have a significant intra-articular effusion to suggest septic arthritis.  Will treat conservatively.  Ultrasound does not demonstrate any clots in the leg.  ED Discharge Orders        Ordered    naproxen (NAPROSYN) 500 MG tablet  Daily     05/25/17 1833       Carlisle Cater, Hershal Coria 05/25/17 Duanne Limerick, MD 05/25/17 [phone]

## 2017-05-25 NOTE — Discharge Instructions (Signed)
Please read and follow all provided instructions.  Your diagnoses today include:  1. Prepatellar bursitis of right knee     Tests performed today include:  An x-ray of the affected area - does NOT show any broken bones but does show swelling in front of the knee  Blood counts and electrolytes  Ultrasound to check for blood clot -no blood clot  Vital signs. See below for your results today.   Medications prescribed:   Naproxen - anti-inflammatory pain medication  Do not exceed 500mg  naproxen every 24 hours, take with food  You have been prescribed an anti-inflammatory medication or NSAID. Take with food. Take smallest effective dose for the shortest duration needed for your pain. Stop taking if you experience stomach pain or vomiting.   Take any prescribed medications only as directed.  Home care instructions:   Follow any educational materials contained in this packet  Follow R.I.C.E. Protocol:  R - rest your injury   I  - use ice on injury without applying directly to skin  C - compress injury with bandage or splint  E - elevate the injury as much as possible  Follow-up instructions: Please follow-up with your primary care provider if you continue to have significant pain in 1 week. In this case you may have a more severe injury that requires further care.   Return instructions:   Please return if your toes or feet are numb or tingling, appear gray or blue, or you have severe pain (also elevate the leg and loosen splint or wrap if you were given one)  Please return to the Emergency Department if you experience worsening symptoms.   Please return if you have any other emergent concerns.  Additional Information:  Your vital signs today were: BP (!) 182/82    Pulse 79    Temp 98.9 F (37.2 C) (Oral)    Resp 15    SpO2 98%  If your blood pressure (BP) was elevated above 135/85 this visit, please have this repeated by your doctor within one  month. -------------- If prescribed crutches for your injury: use crutches with non-weight bearing for the first few days. Then, you may walk as the pain allows, or as instructed. Start gradually with weight bearing on the affected side. Once you can walk pain free, then try jogging. When you can run forwards, then you can try moving side-to-side. If you cannot walk without crutches in one week, you need a re-check. --------------

## 2017-05-25 NOTE — ED Notes (Signed)
Pt ambulating to the bathroom with a crutch and his cane.  Pt refusing assistance, a wheelchair,  or the use of his other crutch.

## 2017-05-25 NOTE — Progress Notes (Signed)
Orthopedic Tech Progress Note Patient Details:  Jeffrey [name] Jun 13, 1955 [id number]  Ortho Devices Type of Ortho Device: Crutches Ortho Device/Splint Location: applied crutches to pt.  pt agreed to fit.  pt stood up and refused to walk, however pt got up out of bed without assistance and stood on his own. Ortho Device/Splint Interventions: Application, Adjustment   Post Interventions Patient Tolerated: Well Instructions Provided: Adjustment of device, Care of device   Kristopher Oppenheim 05/25/2017, 5:05 PM

## 2017-05-25 NOTE — ED Notes (Signed)
Pt transported to vascular.  °

## 2017-05-26 ENCOUNTER — Telehealth: Payer: Self-pay | Admitting: *Deleted

## 2017-05-26 NOTE — Telephone Encounter (Signed)
Pt called because he thought his Rx was called in to his pharmacy ([facility]). EDCM advised pt to look through discharge paperwork to find bluish-purple prescription.  Pt found it; EDCM advised him to take to pharmacy to have it filled.  Pt repeated instructions to have Rx filled promptly.  No further EDCM needs identified.

## 2017-12-31 ENCOUNTER — Ambulatory Visit: Payer: Medicaid Other | Admitting: Family Medicine

## 2018-01-27 ENCOUNTER — Encounter: Payer: Self-pay | Admitting: Family Medicine

## 2018-01-27 ENCOUNTER — Ambulatory Visit: Payer: Medicaid Other | Attending: Family Medicine | Admitting: Family Medicine

## 2018-01-27 ENCOUNTER — Other Ambulatory Visit: Payer: Self-pay

## 2018-01-27 VITALS — BP 124/80 | HR 80 | Temp 98.7°F | Resp 18 | Ht 68.0 in | Wt 104.4 lb

## 2018-01-27 DIAGNOSIS — H5462 Unqualified visual loss, left eye, normal vision right eye: Secondary | ICD-10-CM

## 2018-01-27 DIAGNOSIS — F1020 Alcohol dependence, uncomplicated: Secondary | ICD-10-CM | POA: Insufficient documentation

## 2018-01-27 DIAGNOSIS — Z2821 Immunization not carried out because of patient refusal: Secondary | ICD-10-CM | POA: Insufficient documentation

## 2018-01-27 DIAGNOSIS — Z7689 Persons encountering health services in other specified circumstances: Secondary | ICD-10-CM | POA: Insufficient documentation

## 2018-01-27 DIAGNOSIS — N183 Chronic kidney disease, stage 3 unspecified: Secondary | ICD-10-CM | POA: Insufficient documentation

## 2018-01-27 DIAGNOSIS — M25562 Pain in left knee: Secondary | ICD-10-CM | POA: Diagnosis not present

## 2018-01-27 DIAGNOSIS — G8929 Other chronic pain: Secondary | ICD-10-CM | POA: Insufficient documentation

## 2018-01-27 DIAGNOSIS — N1832 Chronic kidney disease, stage 3b: Secondary | ICD-10-CM | POA: Insufficient documentation

## 2018-01-27 DIAGNOSIS — M25561 Pain in right knee: Secondary | ICD-10-CM | POA: Diagnosis not present

## 2018-01-27 DIAGNOSIS — B1920 Unspecified viral hepatitis C without hepatic coma: Secondary | ICD-10-CM | POA: Insufficient documentation

## 2018-01-27 DIAGNOSIS — F17210 Nicotine dependence, cigarettes, uncomplicated: Secondary | ICD-10-CM | POA: Insufficient documentation

## 2018-01-27 DIAGNOSIS — D649 Anemia, unspecified: Secondary | ICD-10-CM

## 2018-01-27 DIAGNOSIS — B182 Chronic viral hepatitis C: Secondary | ICD-10-CM | POA: Insufficient documentation

## 2018-01-27 DIAGNOSIS — D631 Anemia in chronic kidney disease: Secondary | ICD-10-CM | POA: Diagnosis not present

## 2018-01-27 DIAGNOSIS — H409 Unspecified glaucoma: Secondary | ICD-10-CM | POA: Insufficient documentation

## 2018-01-27 DIAGNOSIS — Z720 Tobacco use: Secondary | ICD-10-CM

## 2018-01-27 DIAGNOSIS — H9192 Unspecified hearing loss, left ear: Secondary | ICD-10-CM | POA: Insufficient documentation

## 2018-01-27 DIAGNOSIS — N189 Chronic kidney disease, unspecified: Secondary | ICD-10-CM

## 2018-01-27 DIAGNOSIS — R21 Rash and other nonspecific skin eruption: Secondary | ICD-10-CM | POA: Diagnosis not present

## 2018-01-27 DIAGNOSIS — F1029 Alcohol dependence with unspecified alcohol-induced disorder: Secondary | ICD-10-CM | POA: Diagnosis not present

## 2018-01-27 DIAGNOSIS — K769 Liver disease, unspecified: Secondary | ICD-10-CM | POA: Insufficient documentation

## 2018-01-27 NOTE — Progress Notes (Signed)
Subjective:    Patient ID: Jeffrey [name], male    DOB: 1955/05/10, 62 y.o.   MRN: [id number]  HPI       62 year old male new to the practice who presents to establish care of chronic medical issues which he reports consist of hepatitis C, vision loss in the left eye, hearing loss in the left ear, liver disease and kidney disease.  Patient reports that he has been receiving care through a local medical practice but he is not pleased with his care there therefore he would like to switch to this office.  Patient also reports that he has issues with bilateral knee pain which can affect his gait at times and therefore patient uses a cane for gait and balance.  Patient also reports sensation of itching all over as well as a recurrent rash/change in his skin on his hands for which he uses an ointment that he would like to have refilled.  Patient states that he believes that the generalized itching he is experiencing is related to his liver disease.  Patient states that he has spoken with someone in the past regarding treatment for hepatitis C but states that he was told that because he continues to drink alcohol, he is not a candidate for treatment.  Patient states that he is not interested in any type of program to help stop drinking.  Patient also smokes but states that he is down to 5 to 6 cigarettes/day but is not interested in smoking cessation.      Patient reports that he is currently disabled.  Patient is single and lives alone.  Patient reports a family history significant for his mother having had male cancer and patient states that he has a sister who currently has mouth cancer.  Patient denies any previous surgeries. Past Medical History:  Diagnosis Date  . Cataract   . Glaucoma   . Hearing deficit   . Hepatitis C   . Renal disorder   Surgical history: At today's visit, patient denies any prior surgeries Family History  Problem Relation Age of Onset  . Cancer Mother   . Cancer Sister     Social History   Tobacco Use  . Smoking status: Current Every Day Smoker    Packs/day: 0.25    Types: Cigarettes  . Smokeless tobacco: Never Used  Substance Use Topics  . Alcohol use: Yes    Comment: daily   . Drug use: No  No Known Allergies    Review of Systems  Constitutional: Positive for fatigue. Negative for chills, diaphoresis and fever.  HENT: Positive for hearing loss. Negative for ear pain, sore throat and trouble swallowing.   Eyes: Positive for visual disturbance. Negative for photophobia, pain, discharge, redness and itching.  Respiratory: Negative for cough and shortness of breath.   Cardiovascular: Negative for chest pain, palpitations and leg swelling.  Gastrointestinal: Positive for abdominal pain. Negative for blood in stool, constipation, diarrhea and nausea.  Endocrine: Negative for polydipsia, polyphagia and polyuria.  Genitourinary: Negative for difficulty urinating, dysuria, flank pain and frequency.  Musculoskeletal: Positive for arthralgias, back pain, gait problem and joint swelling.  Neurological: Negative for dizziness, numbness and headaches.       Objective:   Physical Exam BP 124/80   Pulse 80   Temp 98.7 F (37.1 C) (Oral)   Resp 18   Ht 5\' 8"  (1.727 m)   Wt 104 lb 6.4 oz (47.4 kg)   SpO2 98%   BMI 15.87  kg/m Vital signs and nurse's note reviewed General- thin/small framed older male in no acute distress.  Patient wore wraparound sunglasses/shades throughout his visit and patient initially said in the exam chair with his arms folded across his chest and seemed reluctant to engage but eventually started talking about his medical issues ENT- TMs dull bilaterally, ear canals were devoid of any cerumen, patient with mild edema of the nasal turbinates, patient with mild posterior pharynx erythema and patient with poor dentition with missing teeth but did not have any acute cavities/gum swelling Neck-supple, no lymphadenopathy, no thyromegaly,  no carotid bruit Lungs-clear to auscultation bilaterally Abdomen- patient with some complaint of tenderness of the right upper quadrant and right lower portion of the rib cage/chest wall.  No rebound or guarding of the abdomen Lungs-no CVA tenderness.  Patient did have some complaint of lumbosacral midline lower back pain Extremities-no edema Musculoskeletal- patient with some complaint of bilateral knee joint line tenderness and tenderness over the right mid to lower patella        Assessment & Plan:  1. Chronic hepatitis C without hepatic coma ([place]) Patient reports a prior diagnosis of hepatitis C for which he states that he did see a specialist and was told that he was not a candidate for treatment secondary to his continued use of alcohol.  Patient will have CMP done at today's visit.  As patient did have some right upper quadrant discomfort on examination, at his next visit, will find out if patient is agreeable to ultrasound or CT scan of the abdomen and follow-up of history of hepatitis C and right upper quadrant discomfort on exam - Comprehensive metabolic panel  2. Chronic kidney disease, unspecified CKD stage Patient reports that he has kidney disease.  On review of chart, patient does have BMP which was done in February of this year which showed an elevation in creatinine of 1.46.  I discussed with the patient that he would need to sign medical record release from his current care providers so that his records including any other further labs or imaging can be reviewed and patient agreed to do so. - Comprehensive metabolic panel  3. Hearing loss of left ear, unspecified hearing loss type Patient with complaint of hearing loss in his left ear of unknown origin and patient agrees to referral to ear nose and throat for further evaluation.  Patient states that he has been told in the past that there were no local ENT groups who would accept his insurance but discussed with patient that I  would put in a referral to see if this is the case. - Ambulatory referral to ENT  4. Chronic pain of both knees Patient with complaint of bilateral chronic knee pain.  Referral was offered to orthopedics which patient declined at this time.  I did discuss with the patient that he may wish to try and apply for scat services as patient with several medical conditions including his decreased vision, decreased hearing, as well as chronic pain with impaired gait which may qualify him for transportation options which can help with his compliance with follow-up of appointments and decrease the amount of time that he might have to wait outside for transportation as well as decrease the distance that he would have to walk for transportation as patient states that he currently uses the city bus service  5. Vision loss of left eye Patient with complaint of vision loss in the left eye and on review of medical notes in the  chart from emergency department visits, patient may possibly have a history of glaucoma as well as cataract which is contributing to his visual difficulty.  Patient will be referred to ophthalmology for further evaluation and treatment - Ambulatory referral to Ophthalmology  6. Rash and nonspecific skin eruption Patient with some hyperpigmented and dry skin on his hands bilaterally and patient request refill of an ointment that he is used previously but he cannot recall the name.  I had the CMA contact the pharmacy that patient states that he is most recently gotten medications filled that.  Patient reported that he has most recently used to [facility] on [address].  Per the CMA, this pharmacy is no longer an operation.  Per CMA, she offered to have medication [facility] in the past or to the pharmacy here in our building and she reports that patient was not interested in having medication sent in even though he has Medicaid, patient stated that he  was not going to pay for any medications.  Per CMA she did remind patient that with his Medicaid he would receive a reduced cost for the medications and patient was still not interested in having medication sent to any pharmacy.  7. Alcohol dependence with unspecified alcohol-induced disorder [facility]) Patient reports ongoing alcohol use/dependence and states that he is not interested in any type of program to help with abstinence from drinking at this time.  Patient is aware that the use of alcohol is likely causing further damage to his liver as well as preventing treatment for his current hepatitis C.  8. Anemia, unspecified type On review of patient's labs, patient with CBC done on 05/25/2017 showing hemoglobin of 12.0 with MCV of 103.8.  Patient with anemia which could be related to gastritis from alcohol use, anemia of chronic kidney disease related to elevated creatinine and patient with elevated MCV likely representing folate or B12 deficiency related to patient's chronic alcohol use.  Order was placed for CBC to be done in follow-up of patient's prior anemia and patient additionally had thrombocytopenia with a platelet count of 69 which is likely related to patient's liver disease. - CBC with Differential  *Patient had paperwork with him to change Medicaid provider.  Per front office staff, this health system has not yet decided which if any Medicaid programs it will be contracted with this fall.  I did make patient aware that he would have until January 2019 per staff to change Medicaid providers if this clinic/health system does not participate in healthcare coverage regarding his current insurance.  Patient did not have a list of medications with him at today's visit and CMA was asked to obtain a list of patient's current medications from his most recent pharmacy.  Patient was also asked to sign medical record release so that his records could be obtained from his current health care provider so that  his records can be reviewed as to his health status and prior test and imaging to avoid duplication.  *Patient was offered but refused influenza immunization at today's visit  An After Visit Summary was printed and given to the patient.  Return in about 6 weeks (around 03/10/2018).

## 2018-01-27 NOTE — Progress Notes (Signed)
Flu shot: no Pain: back pain: 10,   Has paperwork requesting pcp to fill out.

## 2018-01-28 LAB — COMPREHENSIVE METABOLIC PANEL WITH GFR
ALT: 83 IU/L — ABNORMAL HIGH (ref 0–44)
AST: 108 IU/L — ABNORMAL HIGH (ref 0–40)
Albumin/Globulin Ratio: 1.4 (ref 1.2–2.2)
Albumin: 4.2 g/dL (ref 3.6–4.8)
Alkaline Phosphatase: 111 IU/L (ref 39–117)
BUN/Creatinine Ratio: 16 (ref 10–24)
BUN: 28 mg/dL — ABNORMAL HIGH (ref 8–27)
Bilirubin Total: 0.3 mg/dL (ref 0.0–1.2)
CO2: 19 mmol/L — ABNORMAL LOW (ref 20–29)
Calcium: 9.2 mg/dL (ref 8.6–10.2)
Chloride: 103 mmol/L (ref 96–106)
Creatinine, Ser: 1.71 mg/dL — ABNORMAL HIGH (ref 0.76–1.27)
GFR calc Af Amer: 49 mL/min/1.73 — ABNORMAL LOW
GFR calc non Af Amer: 42 mL/min/1.73 — ABNORMAL LOW
Globulin, Total: 2.9 g/dL (ref 1.5–4.5)
Glucose: 73 mg/dL (ref 65–99)
Potassium: 4.4 mmol/L (ref 3.5–5.2)
Sodium: 139 mmol/L (ref 134–144)
Total Protein: 7.1 g/dL (ref 6.0–8.5)

## 2018-01-28 LAB — CBC WITH DIFFERENTIAL/PLATELET
Basophils Absolute: 0 x10E3/uL (ref 0.0–0.2)
Basos: 1 %
EOS (ABSOLUTE): 0.1 x10E3/uL (ref 0.0–0.4)
Eos: 3 %
Hematocrit: 30.9 % — ABNORMAL LOW (ref 37.5–51.0)
Hemoglobin: 11.3 g/dL — ABNORMAL LOW (ref 13.0–17.7)
Immature Grans (Abs): 0 x10E3/uL (ref 0.0–0.1)
Immature Granulocytes: 0 %
Lymphocytes Absolute: 1.7 x10E3/uL (ref 0.7–3.1)
Lymphs: 45 %
MCH: 37 pg — ABNORMAL HIGH (ref 26.6–33.0)
MCHC: 36.6 g/dL — ABNORMAL HIGH (ref 31.5–35.7)
MCV: 101 fL — ABNORMAL HIGH (ref 79–97)
Monocytes Absolute: 0.4 x10E3/uL (ref 0.1–0.9)
Monocytes: 13 %
Neutrophils Absolute: 1.4 x10E3/uL (ref 1.4–7.0)
Neutrophils: 38 %
Platelets: 77 x10E3/uL — CL (ref 150–450)
RBC: 3.05 x10E6/uL — ABNORMAL LOW (ref 4.14–5.80)
RDW: 13 % (ref 12.3–15.4)
WBC: 3.7 x10E3/uL (ref 3.4–10.8)

## 2018-01-31 ENCOUNTER — Telehealth: Payer: Self-pay

## 2018-01-31 NOTE — Telephone Encounter (Signed)
At his visit, he was refusing to have medications refilled even the cream he requested as he did not want to pay for medications and the pharmacy he mentioned had gone out of business

## 2018-01-31 NOTE — Telephone Encounter (Signed)
Call placed to the patient to discuss SCAT. Informed him that he can come to the clinic to pick up an application. This CM can assist him with completing it as needed. He said that he would come to the clinic tomorrow.  Also informed him that he is eligible for medicaid transportation to medical appointments and he said that he already knew that.    He also said that he has an appointment with Dr Chapman Fitch on 03/10/18. Informed him that has not been set up yet but he was agreeable to having this CM schedule an appointment for him - 03/10/18 @ 0950.  He then stated that he doesn't have any of the medications that he is supposed to have. He said that he used to get his medications from [facility] and he didn't have to pay anything and they were delivered.  Informed him that we do not know what, if anything, he would need to pay out of pocket until the medications are ordered. We have no control what those costs would be.  He also wanted delivery to his home.  Informed him that [facility], [facility] and [facility] provide home delivery. He didn't have a preference. He was then agreeable to his prescriptions going to [facility].  Informed him that Dr Chapman Fitch would be notified that he is requesting his "medicine" however he could not state exactly what medications he wants/needs.

## 2018-02-01 NOTE — Telephone Encounter (Signed)
Please contact patient and see if there is a way to find a list of the medications that he was most recently taking as we really do not have any information regarding the patient's medications

## 2018-02-02 NOTE — Telephone Encounter (Signed)
Please reattempt to contact the patient and find out if he has old medication bottles or if there is any way to find out what medications that require refill at this time

## 2018-02-17 NOTE — Telephone Encounter (Signed)
Can you attempt to contact patient and see if he has any idea what medications that he took in the past and for what reason. I am unable to refill anything for him as I have no record of any medication that he was on in the past

## 2018-02-21 NOTE — Telephone Encounter (Signed)
Attempt to call patient to ask which medications he is taking. He does not have a list or bottle with him. He states we should have gotten a list on last month, 01/27/2018. Called [facility] to see if they would release medication name. Left message on voicemail for CMA to return call.

## 2018-02-28 NOTE — Telephone Encounter (Signed)
Can you attempt to contact the patient and see if he has any idea of what medications he was taking in the past such as a list or old medication bottles

## 2018-03-01 ENCOUNTER — Telehealth: Payer: Self-pay

## 2018-03-01 NOTE — Telephone Encounter (Signed)
Call placed to the patient at the request of Dr Chapman Fitch to inquire if the patient has any list of his medications or bottles of medication.  He said that he does not have a list but does have some cream and eye drops.  This CM encouraged him to look at all empty prescription bottles and check for any paperwork from his prior doctor that might list his medications  He said that he was not home and would need to check when he gets home and call this CM back. Marland Kitchen He requested that this CM text him with CM call back #.  CM text him with the # requested.

## 2018-03-02 ENCOUNTER — Telehealth: Payer: Self-pay

## 2018-03-02 ENCOUNTER — Other Ambulatory Visit: Payer: Self-pay | Admitting: Family Medicine

## 2018-03-02 DIAGNOSIS — H539 Unspecified visual disturbance: Secondary | ICD-10-CM

## 2018-03-02 DIAGNOSIS — R21 Rash and other nonspecific skin eruption: Secondary | ICD-10-CM

## 2018-03-02 MED ORDER — CLOBETASOL PROPIONATE 0.05 % EX OINT: 1.0000 "application " | TOPICAL_OINTMENT | Freq: Two times a day (BID) | CUTANEOUS | 6 refills | Status: DC

## 2018-03-02 MED ORDER — TOBRAMYCIN 0.3 % OP SOLN: 1.0000 [drp] | Freq: Three times a day (TID) | OPHTHALMIC | 0 refills | Status: AC

## 2018-03-02 NOTE — Telephone Encounter (Signed)
I sent in a prescription for the tobramycin ophthalmic drops to [facility] for 10 days.  Patient has reported a history of glaucoma but this particular medication is used more so for eye infections or eye irritation.  I also placed a stat ophthalmology referral.  Please see if patient knows who he has seen in the past and follow-up of his vision/glaucoma and please relay this information to Alinda Sierras to help with referral.  Prescription was sent into the patient's pharmacy for clobetasol to help with rash and itching of the skin.  Patient has a history of hepatitis C as well as renal disease/chronic kidney disease- please see if patient knows if he has seen a kidney or liver specialist in the past and if he does know, please try to find out the name of the doctor or name of the facility where he was seen.  Patient reported that he had previously been receiving care through the [facility] for primary care but so far I have not seen any medical records from this facility.

## 2018-03-02 NOTE — Progress Notes (Signed)
Patient ID: Jeffrey [name], male   DOB: 10-09-1955, 62 y.o.   MRN: [id number]   Care management nurse was finally able to get in touch with the patient in an attempt to find out which medications patient has been on in the past.  Patient called back to the care management nurse and spelled out the names of 2 medications, tobramycin ophthalmic drops and clobetasol ointment that he has used in the past.  These 2 prescriptions will be sent into [facility] for the patient.  At patient's last visit, he reported a history of glaucoma with vision loss and was referred to ophthalmology.  There is no indication that the patient followed up with ophthalmology therefore new stat referral will be placed.  Will also have nursing case manager find out if she can find the name of patient's former ophthalmologist from the patient.  We will also asked that she is stressed to the patient that he keep his upcoming follow-up appointment which should occur on December 5.

## 2018-03-02 NOTE — Telephone Encounter (Signed)
2 messages received from the patient. He spelled out the medications - topical ointment and tobramycin ophthalmic solution.  The messages were shared with Dr Chapman Fitch.    Call returned to the patient to inform him that the messages were received. He asked that the prescriptions be sent to [facility] as they will deliver.

## 2018-03-02 NOTE — Telephone Encounter (Signed)
Call placed to the patient and informed him that his prescriptions were sent to [facility]. He requested that this CM text him the pharmacy number and that was done.  Regarding the ophthalmologist, he said that he has seen 3 doctors but could not remember the names.  Will try to think of names by the time he comes to appointment next week.  Regarding kidney/liver specialist, he said that has saw specialists  in [place] years ago but does not remember any names. He said it should be in his records from [facility].  He also said that he has not signed a ROI yet and will sign when he is in the clinic next week.   He then said that he needs has a 50 cent piece size " knot" on his stomach, near rib cage.  That is the best he could describe. He said he will show Dr Chapman Fitch next week.

## 2018-03-10 ENCOUNTER — Ambulatory Visit: Payer: Medicaid Other | Admitting: Family Medicine

## 2019-02-23 ENCOUNTER — Emergency Department (HOSPITAL_COMMUNITY): Payer: Medicaid Other

## 2019-02-23 ENCOUNTER — Encounter (HOSPITAL_COMMUNITY): Payer: Self-pay

## 2019-02-23 ENCOUNTER — Emergency Department (HOSPITAL_COMMUNITY)
Admission: EM | Admit: 2019-02-23 | Discharge: 2019-02-23 | Disposition: A | Discharge status: Home / Self Care | Payer: Medicaid Other | Attending: Emergency Medicine | Admitting: Emergency Medicine

## 2019-02-23 ENCOUNTER — Other Ambulatory Visit: Payer: Self-pay

## 2019-02-23 DIAGNOSIS — M25532 Pain in left wrist: Secondary | ICD-10-CM | POA: Diagnosis present

## 2019-02-23 DIAGNOSIS — F17210 Nicotine dependence, cigarettes, uncomplicated: Secondary | ICD-10-CM | POA: Diagnosis not present

## 2019-02-23 DIAGNOSIS — M778 Other enthesopathies, not elsewhere classified: Secondary | ICD-10-CM | POA: Insufficient documentation

## 2019-02-23 DIAGNOSIS — N189 Chronic kidney disease, unspecified: Secondary | ICD-10-CM | POA: Diagnosis not present

## 2019-02-23 MED ORDER — HYDROCODONE-ACETAMINOPHEN 5-325 MG PO TABS
1.0000 | ORAL_TABLET | Freq: Once | ORAL | Status: AC
  Administered 2019-02-23: 1 via ORAL
  Filled 2019-02-23: qty 1

## 2019-02-23 MED ORDER — PREDNISONE 20 MG PO TABS: 20.0000 mg | ORAL_TABLET | Freq: Two times a day (BID) | ORAL | 0 refills | Status: DC

## 2019-02-23 NOTE — Discharge Instructions (Signed)
The pain and swelling in your left wrist appear to be from tendinitis.  There is no visible injury to your left upper arm.  Use the supplied sling and wrist splint as needed for comfort.  Elevate left arm above your heart as much as possible.  Remove the left wrist splint and apply heat to the sore area 3-4 times a day for 1 hour.  Follow-up with your doctor in 1 week if not better.

## 2019-02-23 NOTE — ED Triage Notes (Signed)
Pt arrived via EMS from pt c/o left arm pain since Sunday, and left hand swelling monday. Pt reports similar occurrence in the past.     EMS 160/100, HR 114, RR 18, O2 98% RA

## 2019-02-23 NOTE — ED Provider Notes (Signed)
[facility] DEPT Provider Note   CSN: [id number] Arrival date & time: 02/23/19  [id number]     History   Chief Complaint No chief complaint on file.   HPI Jeffrey [name] is a 63 y.o. male.     HPI   He presents for evaluation of atraumatic pain and swelling in left arm and left wrist, onset several days ago.  He denies fever, chills, nausea, vomiting, weakness or dizziness.  He states he is chronically disabled and uses a cane because of shortness of breath.  No prior similar problem.  There are no other known modifying factors.  Past Medical History:  Diagnosis Date  . Cataract   . Glaucoma   . Hearing deficit   . Hepatitis C   . Renal disorder     Patient Active Problem List   Diagnosis Date Noted  . Hepatitis C 01/27/2018  . Chronic kidney disease 01/27/2018  . Decreased hearing of left ear 01/27/2018  . Decreased vision of left eye 01/27/2018  . Bilateral chronic knee pain 01/27/2018  . Alcohol dependence ([place]) 01/27/2018  . Tobacco use 01/27/2018    History reviewed. No pertinent surgical history.      Home Medications    Prior to Admission medications   Medication Sig Start Date End Date Taking? Authorizing Provider  clobetasol ointment (TEMOVATE) [id number] % Apply 1 application topically 2 (two) times daily. As needed to affected skin areas 03/02/18   Fulp, Cammie, MD  naproxen (NAPROSYN) 500 MG tablet Take 1 tablet (500 mg total) by mouth daily. Patient not taking: Reported on 01/27/2018 05/25/17   Carlisle Cater, PA-C  predniSONE (DELTASONE) 20 MG tablet Take 1 tablet (20 mg total) by mouth 2 (two) times daily. 02/23/19   Daleen Bo, MD    Family History Family History  Problem Relation Age of Onset  . Cancer Mother   . Cancer Sister     Social History Social History   Tobacco Use  . Smoking status: Current Every Day Smoker    Packs/day: 0.25    Types: Cigarettes  . Smokeless tobacco: Never Used  Substance Use  Topics  . Alcohol use: Yes    Comment: daily   . Drug use: No     Allergies   Patient has no known allergies.   Review of Systems Review of Systems  All other systems reviewed and are negative.    Physical Exam Updated Vital Signs BP (!) 162/107   Pulse 97   Temp 98.9 F (37.2 C) (Oral)   SpO2 100%   Physical Exam Vitals signs and nursing note reviewed.  Constitutional:      Appearance: He is well-developed.  HENT:     Head: Normocephalic and atraumatic.     Right Ear: External ear normal.     Left Ear: External ear normal.  Eyes:     Conjunctiva/sclera: Conjunctivae normal.     Pupils: Pupils are equal, round, and reactive to light.  Neck:     Musculoskeletal: Normal range of motion and neck supple.     Trachea: Phonation normal.  Cardiovascular:     Rate and Rhythm: Normal rate and regular rhythm.     Heart sounds: Normal heart sounds.  Pulmonary:     Effort: Pulmonary effort is normal.     Breath sounds: Normal breath sounds.  Abdominal:     General: There is no distension.     Palpations: Abdomen is soft.     Tenderness: There  is no abdominal tenderness.  Musculoskeletal:     Comments: He guards against movement of the left shoulder, and left wrist secondary to pain.  Left shoulder and left upper arm appear normal but the left  humerus is tender in the middle portion.  Left elbow is without swelling or deformity.  Left wrist is tender and swollen with mild erythema.  Intact circulation sensation left fingers which also appear normal.  Skin:    General: Skin is warm and dry.  Neurological:     Mental Status: He is alert and oriented to person, place, and time.     Cranial Nerves: No cranial nerve deficit.     Sensory: No sensory deficit.     Motor: No abnormal muscle tone.     Coordination: Coordination normal.  Psychiatric:        Mood and Affect: Mood normal.        Behavior: Behavior normal.        Thought Content: Thought content normal.         Judgment: Judgment normal.      ED Treatments / Results  Labs (all labs ordered are listed, but only abnormal results are displayed) Labs Reviewed - No data to display  EKG None  Radiology Dg Wrist Complete Left  Result Date: 02/23/2019 CLINICAL DATA:  63 year old male with left wrist pain. EXAM: LEFT WRIST - COMPLETE 3+ VIEW COMPARISON:  Left hand radiograph dated 07/03 11/2006. FINDINGS: There is no acute fracture or dislocation. Old ulnar styloid fracture. The bones are osteopenic. There is soft tissue swelling of the wrist. No radiopaque foreign object or soft tissue gas. IMPRESSION: 1. No acute fracture or dislocation. 2. Soft tissue swelling of the wrist. Electronically Signed   By: Anner Crete M.D.   On: 02/23/2019 10:12   Dg Humerus Left  Result Date: 02/23/2019 CLINICAL DATA:  Left arm pain. EXAM: LEFT HUMERUS - 2+ VIEW COMPARISON:  None. FINDINGS: There is no evidence of fracture or other focal bone lesions. Soft tissues are unremarkable. IMPRESSION: Negative. Electronically Signed   By: Marijo Conception M.D.   On: 02/23/2019 10:11    Procedures Procedures (including critical care time)  Medications Ordered in ED Medications  HYDROcodone-acetaminophen (NORCO/VICODIN) 5-325 MG per tablet 1 tablet (1 tablet Oral Given 02/23/19 1046)     Initial Impression / Assessment and Plan / ED Course  I have reviewed the triage vital signs and the nursing notes.  Pertinent labs & imaging results that were available during my care of the patient were reviewed by me and considered in my medical decision making (see chart for details).  Clinical Course as of Feb 23 1151  Thu Feb 23, 2019  1151 Images left wrist and left humerus, interpreted by me.  No dislocation or fracture.   [EW]    Clinical Course User Index [EW] Daleen Bo, MD        Patient Vitals for the past 24 hrs:  BP Temp Temp src Pulse SpO2  02/23/19 0844 - - - 97 100 %  02/23/19 0843 (!) 162/107 - -  (!) 104 98 %  02/23/19 0840 - 98.9 F (37.2 C) Oral - 100 %    11:52 AM Reevaluation with update and discussion. After initial assessment and treatment, an updated evaluation reveals he is comfortable at this time in the splint.  Findings discussed with the patient all questions were answered. Daleen Bo   Medical Decision Making: Nonspecific pain and swelling left wrist and  pain in left upper arm.  Imaging is negative for fracture.  Possible tendinitis causing pain and swelling.  Doubt septic arthritis.  Doubt occult fracture.  Doubt radiculopathy.  Doubt cellulitis.  CRITICAL CARE-no Performed by: Daleen Bo  Nursing Notes Reviewed/ Care Coordinated Applicable Imaging Reviewed Interpretation of Laboratory Data incorporated into ED treatment  The patient appears reasonably screened and/or stabilized for discharge and I doubt any other medical condition or other [facility] requiring further screening, evaluation, or treatment in the ED at this time prior to discharge.  Plan: Home Medications-routine OTC analgesia, Tylenol; Home Treatments-heat, elevation, sling, wrist splint; return here if the recommended treatment, does not improve the symptoms; Recommended follow up-PCP, as needed   Final Clinical Impressions(s) / ED Diagnoses   Final diagnoses:  Tendinitis of left wrist    ED Discharge Orders         Ordered    predniSONE (DELTASONE) 20 MG tablet  2 times daily     02/23/19 1151           Daleen Bo, MD 02/23/19 1152

## 2019-04-03 ENCOUNTER — Other Ambulatory Visit: Payer: Self-pay

## 2019-04-03 ENCOUNTER — Encounter (HOSPITAL_COMMUNITY): Payer: Self-pay | Admitting: Emergency Medicine

## 2019-04-03 ENCOUNTER — Emergency Department (HOSPITAL_COMMUNITY)
Admission: EM | Admit: 2019-04-03 | Discharge: 2019-04-03 | Disposition: A | Discharge status: Home / Self Care | Payer: Medicaid Other | Attending: Emergency Medicine | Admitting: Emergency Medicine

## 2019-04-03 DIAGNOSIS — Z5321 Procedure and treatment not carried out due to patient leaving prior to being seen by health care provider: Secondary | ICD-10-CM | POA: Insufficient documentation

## 2019-04-03 DIAGNOSIS — M79642 Pain in left hand: Secondary | ICD-10-CM | POA: Diagnosis present

## 2019-04-03 NOTE — ED Notes (Signed)
Patient reports he does not want to be seen. States "my ride is coming to get me now."

## 2019-04-03 NOTE — ED Triage Notes (Signed)
Pt reports tendonitis flared back up in left hand couple days ago. Reports "pain is 100 that's why I am here". Denies any injuries or traumas.

## 2020-07-08 IMAGING — CR DG WRIST COMPLETE 3+V*L*
5 series · 5 of 5 positions shown · non-contrast
Comparison: Left hand radiograph dated [DATE] [DATE].

CLINICAL DATA: 63-year-old male with left wrist pain.

EXAM:
LEFT WRIST - COMPLETE 3+ VIEW

[x wrist pa left]
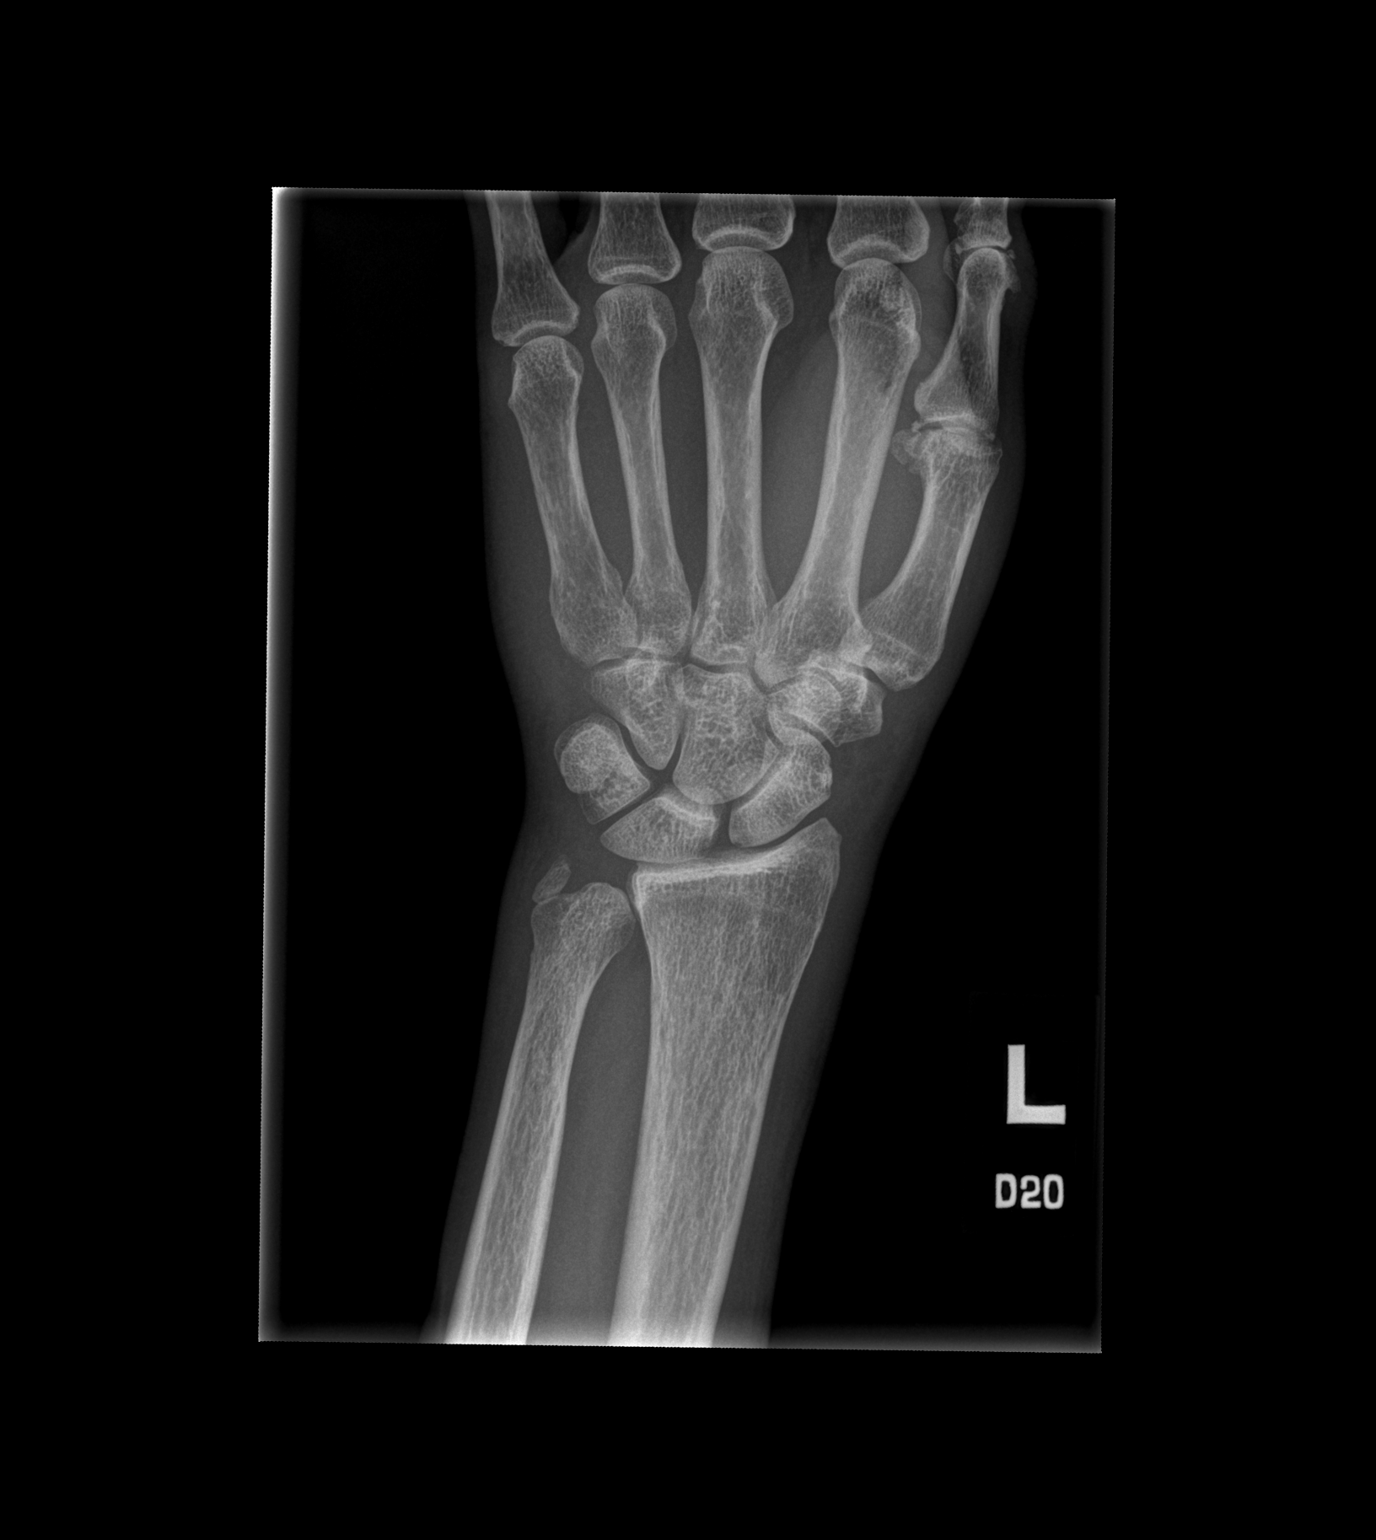

[x wrist obl left]
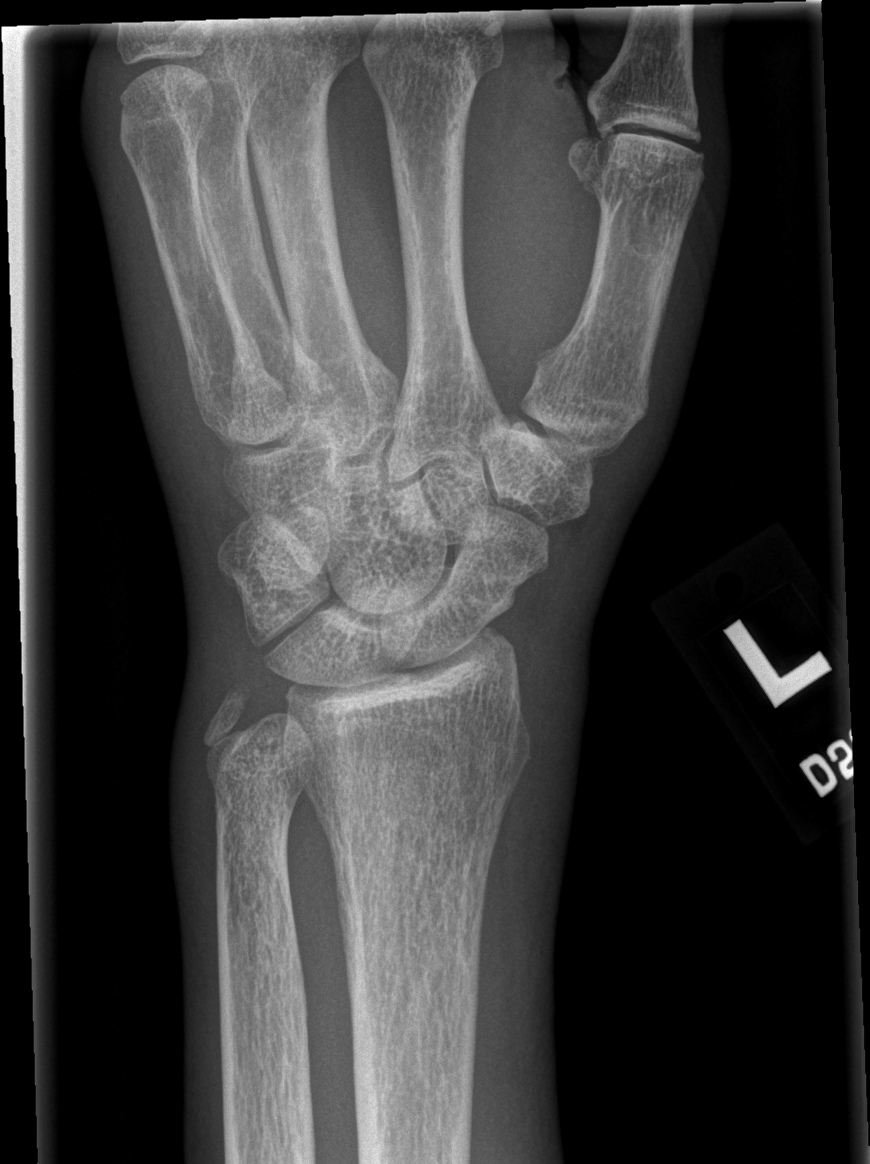

[x wrist lat left]
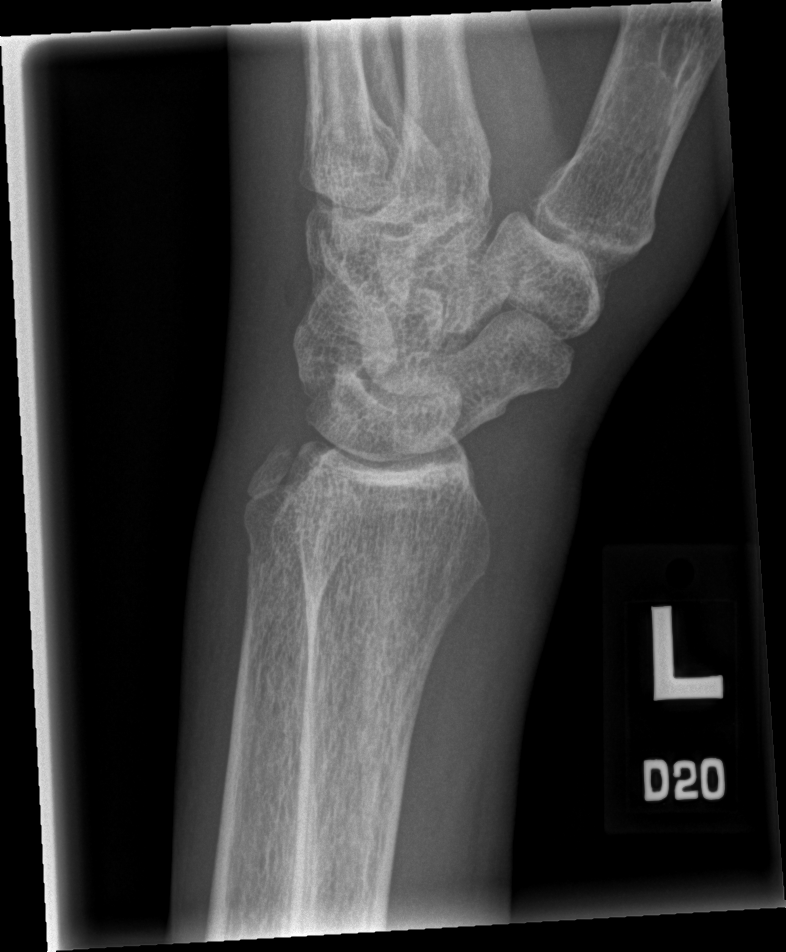

[x wrist navicular view left (1 of 2)]
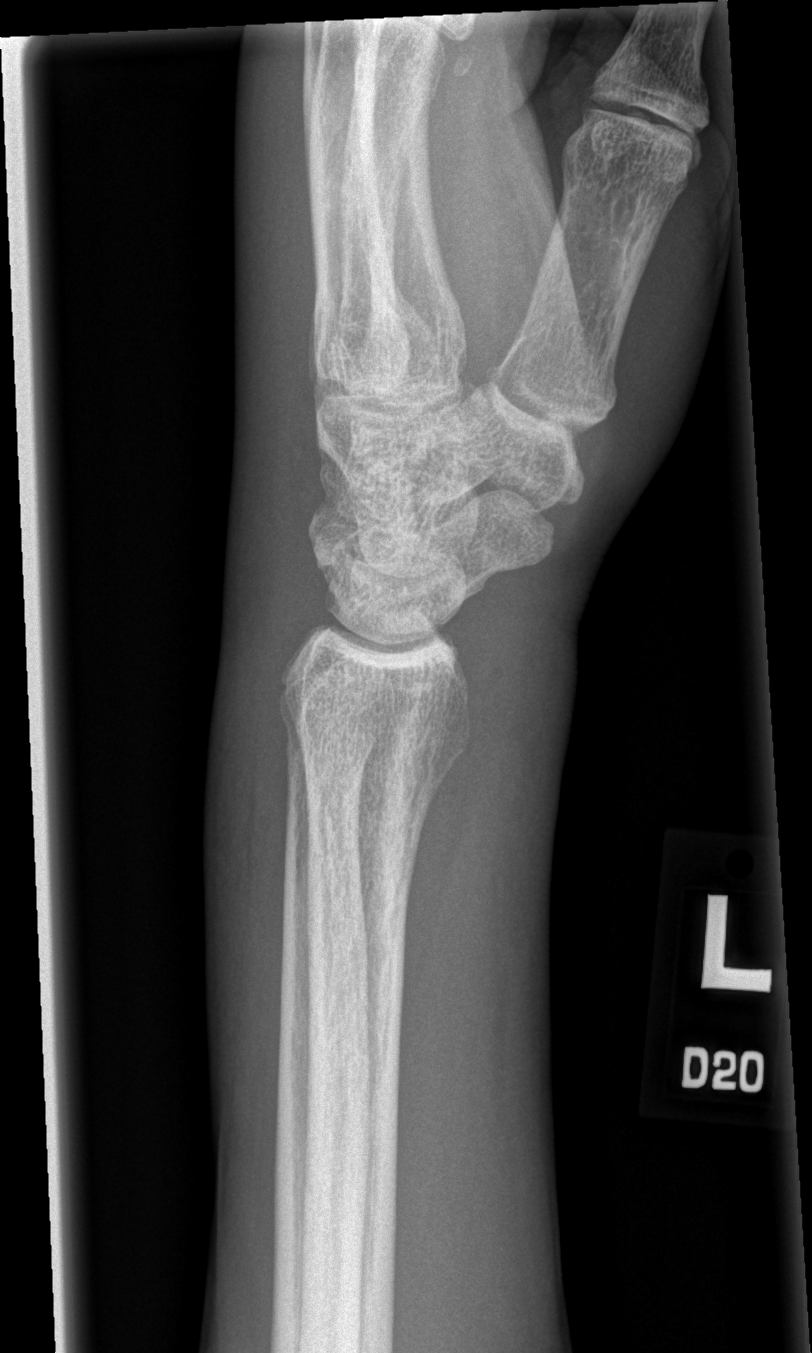

[x wrist navicular view left (2 of 2)]
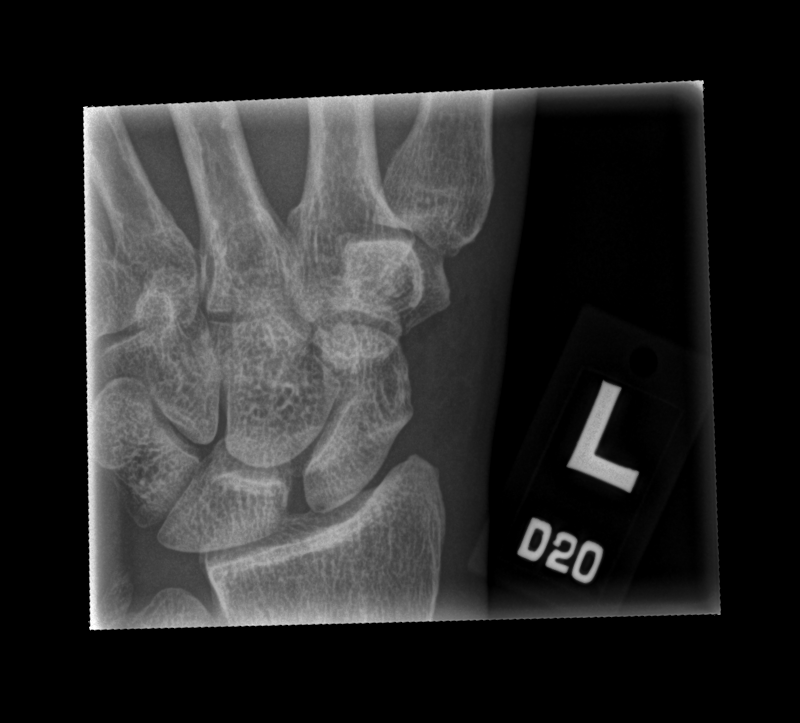

[5 of 5 positions shown; findings below may reference images not displayed]

FINDINGS: There is no acute fracture or dislocation. Old ulnar styloid
fracture. The bones are osteopenic. There is soft tissue swelling of
the wrist. No radiopaque foreign object or soft tissue gas.
IMPRESSION: 1. No acute fracture or dislocation.
2. Soft tissue swelling of the wrist.

## 2022-02-19 ENCOUNTER — Telehealth: Payer: Self-pay | Admitting: Emergency Medicine

## 2022-02-19 NOTE — Telephone Encounter (Signed)
Attempted to contact patient to confirm new patient appt for 11/21, patient did not answer, VM not setup

## 2022-02-24 ENCOUNTER — Ambulatory Visit: Payer: Medicare Other | Admitting: Nurse Practitioner

## 2023-02-01 ENCOUNTER — Emergency Department (HOSPITAL_COMMUNITY): Payer: 59

## 2023-02-01 ENCOUNTER — Encounter (HOSPITAL_COMMUNITY): Payer: Self-pay | Admitting: Emergency Medicine

## 2023-02-01 ENCOUNTER — Emergency Department (HOSPITAL_COMMUNITY)
Admission: EM | Admit: 2023-02-01 | Discharge: 2023-02-01 | Disposition: A | Discharge status: Home / Self Care | Payer: 59 | Attending: Emergency Medicine | Admitting: Emergency Medicine

## 2023-02-01 ENCOUNTER — Other Ambulatory Visit: Payer: Self-pay

## 2023-02-01 DIAGNOSIS — Y92410 Unspecified street and highway as the place of occurrence of the external cause: Secondary | ICD-10-CM | POA: Diagnosis not present

## 2023-02-01 DIAGNOSIS — S6992XA Unspecified injury of left wrist, hand and finger(s), initial encounter: Secondary | ICD-10-CM | POA: Insufficient documentation

## 2023-02-01 DIAGNOSIS — M25532 Pain in left wrist: Secondary | ICD-10-CM | POA: Diagnosis present

## 2023-02-01 MED ORDER — HYDROCODONE-ACETAMINOPHEN 5-325 MG PO TABS
1.0000 | ORAL_TABLET | Freq: Once | ORAL | Status: AC
  Administered 2023-02-01: 1 via ORAL
  Filled 2023-02-01: qty 1

## 2023-02-01 MED ORDER — HYDROCODONE-ACETAMINOPHEN 5-325 MG PO TABS: 1.0000 | ORAL_TABLET | Freq: Four times a day (QID) | ORAL | 0 refills | Status: DC | PRN

## 2023-02-01 NOTE — ED Triage Notes (Signed)
Pt BIB [facility] for 10/10 left wrist pain r/t a fall this past Tuesday. [place] city bus, fell and caught himself with his left hand.  Didn't hurt much at the time but began swelling 2 days ago. Marland KitchenSwelling is noted on observation.  It is very tender to touch and is limiting range of motion in left hand.  Pt has good cap refill.  Pt states he hasn't been able to cook or eat much in last two days so he is very hungry.  He is asking for food, water and pain meds.   140/100 HR 114, 98% RA.  Pt has a cane with him.

## 2023-02-01 NOTE — ED Notes (Signed)
Left message for sister at pt's request.

## 2023-02-01 NOTE — Discharge Instructions (Signed)
Return for any problem.  ?

## 2023-02-01 NOTE — ED Notes (Signed)
Ortho tech called 

## 2023-02-01 NOTE — ED Provider Notes (Signed)
[place] EMERGENCY DEPARTMENT AT [facility] Provider Note   CSN: [id number] Arrival date & time: 02/01/23  1025     History  No chief complaint on file.   Jeffrey [name] is a 67 y.o. male.  67 year old male with medical history as detailed below presents for evaluation.  Patient reports that last week on Tuesday he fell while getting off a city bus.  He landed on his left wrist.  He complains of persistent pain and swelling to the left wrist since the fall that occurred on Tuesday of last week.  He has not yet seen a medical provider for evaluation.  He denies other injury.    The history is provided by the patient and medical records.       Home Medications Prior to Admission medications   Medication Sig Start Date End Date Taking? Authorizing Provider  clobetasol ointment (TEMOVATE) 0.05 % Apply 1 application topically 2 (two) times daily. As needed to affected skin areas 03/02/18   Fulp, Cammie, MD  naproxen (NAPROSYN) 500 MG tablet Take 1 tablet (500 mg total) by mouth daily. Patient not taking: Reported on 01/27/2018 05/25/17   Renne Crigler, PA-C  predniSONE (DELTASONE) 20 MG tablet Take 1 tablet (20 mg total) by mouth 2 (two) times daily. 02/23/19   Mancel Bale, MD      Allergies    Patient has no known allergies.    Review of Systems   Review of Systems  All other systems reviewed and are negative.   Physical Exam Updated Vital Signs BP (!) 153/97   Pulse (!) 114   Temp 99 F (37.2 C)   Resp 17   SpO2 100%  Physical Exam Vitals and nursing note reviewed.  Constitutional:      General: He is not in acute distress.    Appearance: Normal appearance. He is well-developed.  HENT:     Head: Normocephalic and atraumatic.  Eyes:     Conjunctiva/sclera: Conjunctivae normal.     Pupils: Pupils are equal, round, and reactive to light.  Cardiovascular:     Rate and Rhythm: Normal rate and regular rhythm.     Heart sounds: Normal heart sounds.   Pulmonary:     Effort: Pulmonary effort is normal. No respiratory distress.     Breath sounds: Normal breath sounds.  Abdominal:     General: There is no distension.     Palpations: Abdomen is soft.     Tenderness: There is no abdominal tenderness.  Musculoskeletal:        General: Swelling and tenderness present. No deformity. Normal range of motion.     Cervical back: Normal range of motion and neck supple.     Comments: Significant edema and tenderness to the distal left wrist and left hand.  Patient is not comfortable with detailed exam.  He reports significant pain with touching of the area.  He appears to be grossly neurovascular intact.  Skin:    General: Skin is warm and dry.  Neurological:     General: No focal deficit present.     Mental Status: He is alert and oriented to person, place, and time.     ED Results / Procedures / Treatments   Labs (all labs ordered are listed, but only abnormal results are displayed) Labs Reviewed - No data to display  EKG None  Radiology No results found.  Procedures Procedures    Medications Ordered in ED Medications - No data to display  ED Course/ Medical Decision Making/ A&P                                 Medical Decision Making Amount and/or Complexity of Data Reviewed Radiology: ordered.  Risk Prescription drug management.    Medical Screen Complete  This patient presented to the ED with complaint of wrist injury.  This complaint involves an extensive number of treatment options. The initial differential diagnosis includes, but is not limited to, sprain, strain, fracture  This presentation is: Acute, Self-Limited, Previously Undiagnosed, and Uncertain Prognosis  Patient with fall 6 days prior with injury to the left wrist.  Patient's imaging does not suggest acute fracture.  Imaging is somewhat suggestive of possible scapholunate ligament tear.  Splint and sling applied.  Patient is feeling improved  after ED evaluation.  He understands need for close outpatient follow-up with Hand.  Strict return precautions given and understood.  Additional history obtained:  External records from outside sources obtained and reviewed including prior ED visits and prior Inpatient records.   Imaging Studies ordered:  I ordered imaging studies including films of the left hand, left forearm, left wrist  I agree with the radiologist interpretation.  Problem List / ED Course:  Left wrist injury   Reevaluation:  After the interventions noted above, I reevaluated the patient and found that they have: improved  Disposition:  After consideration of the diagnostic results and the patients response to treatment, I feel that the patent would benefit from close outpatient follow-up.          Final Clinical Impression(s) / ED Diagnoses Final diagnoses:  Injury of left wrist, initial encounter    Rx / DC Orders ED Discharge Orders     None         Wynetta Fines, MD 02/01/23 1507

## 2023-02-01 NOTE — ED Notes (Signed)
Pt will wait in lobby for sister to pick up. Pt is A&O x4

## 2023-05-19 ENCOUNTER — Encounter: Payer: Self-pay | Admitting: *Deleted

## 2023-05-19 NOTE — Congregational Nurse Program (Signed)
  Dept: [phone]   Congregational Nurse Program Note  Date of Encounter: 05/19/2023  Past Medical History: Past Medical History:  Diagnosis Date   Cataract    Glaucoma    Hearing deficit    Hepatitis C    Renal disorder     Encounter Details:  Community Questionnaire - 05/19/23 1040       Questionnaire   Ask client: Do you give verbal consent for me to treat you today? Yes    Student Assistance N/A    Location Patient Served  [facility]    Encounter Setting CN site    Population Status Unhoused    Insurance Medicaid;Medicare    Insurance/Financial Assistance Referral N/A    Medication N/A    Medical Provider Yes    Screening Referrals Made N/A    Medical Referrals Made [facility]    Medical Appointment Completed [facility]    CNP Interventions Advocate/Support    Screenings CN Performed Blood Pressure    ED Visit Averted N/A    Life-Saving Intervention Made N/A           Client seen completing intake forms for [facility]. Client asked writer to look at his lt lower leg. LLE is swollen and "sore" x 1 month. Client also says he sometimes sees white spots in his lt eye. Took vitals and completed triage form to see doctor at [facility] this afternoon. Blood pressure (!) 157/84, pulse (!) 103, SpO2 100%.

## 2023-05-20 ENCOUNTER — Emergency Department (HOSPITAL_COMMUNITY)
Admission: EM | Admit: 2023-05-20 | Discharge: 2023-05-20 | Disposition: A | Discharge status: Home / Self Care | Payer: 59 | Attending: Emergency Medicine | Admitting: Emergency Medicine

## 2023-05-20 ENCOUNTER — Emergency Department (HOSPITAL_BASED_OUTPATIENT_CLINIC_OR_DEPARTMENT_OTHER): Payer: 59

## 2023-05-20 ENCOUNTER — Encounter (HOSPITAL_COMMUNITY): Payer: Self-pay

## 2023-05-20 ENCOUNTER — Other Ambulatory Visit: Payer: Self-pay

## 2023-05-20 ENCOUNTER — Encounter: Payer: Self-pay | Admitting: Internal Medicine

## 2023-05-20 DIAGNOSIS — M7989 Other specified soft tissue disorders: Secondary | ICD-10-CM | POA: Diagnosis not present

## 2023-05-20 DIAGNOSIS — M7122 Synovial cyst of popliteal space [Baker], left knee: Secondary | ICD-10-CM | POA: Insufficient documentation

## 2023-05-20 LAB — COMPREHENSIVE METABOLIC PANEL
ALT: 50 U/L — ABNORMAL HIGH (ref 0–44)
AST: 61 U/L — ABNORMAL HIGH (ref 15–41)
Albumin: 3.4 g/dL — ABNORMAL LOW (ref 3.5–5.0)
Alkaline Phosphatase: 94 U/L (ref 38–126)
Anion gap: 9 (ref 5–15)
BUN: 20 mg/dL (ref 8–23)
CO2: 23 mmol/L (ref 22–32)
Calcium: 8.7 mg/dL — ABNORMAL LOW (ref 8.9–10.3)
Chloride: 103 mmol/L (ref 98–111)
Creatinine, Ser: 1.48 mg/dL — ABNORMAL HIGH (ref 0.61–1.24)
GFR, Estimated: 52 mL/min — ABNORMAL LOW (ref >60)
Glucose, Bld: 93 mg/dL (ref 70–99)
Potassium: 3.7 mmol/L (ref 3.5–5.1)
Sodium: 135 mmol/L (ref 135–145)
Total Bilirubin: 0.6 mg/dL (ref 0.0–1.2)
Total Protein: 7 g/dL (ref 6.5–8.1)

## 2023-05-20 LAB — CBC WITH DIFFERENTIAL/PLATELET
Abs Immature Granulocytes: 0.02 10*3/uL (ref 0.00–0.07)
Basophils Absolute: 0 10*3/uL (ref 0.0–0.1)
Basophils Relative: 0 %
Eosinophils Absolute: 0 10*3/uL (ref 0.0–0.5)
Eosinophils Relative: 0 %
HCT: 36 % — ABNORMAL LOW (ref 39.0–52.0)
Hemoglobin: 11.7 g/dL — ABNORMAL LOW (ref 13.0–17.0)
Immature Granulocytes: 0 %
Lymphocytes Relative: 23 %
Lymphs Abs: 1.1 10*3/uL (ref 0.7–4.0)
MCH: 32.7 pg (ref 26.0–34.0)
MCHC: 32.5 g/dL (ref 30.0–36.0)
MCV: 100.6 fL — ABNORMAL HIGH (ref 80.0–100.0)
Monocytes Absolute: 0.7 10*3/uL (ref 0.1–1.0)
Monocytes Relative: 14 %
Neutro Abs: 3.1 10*3/uL (ref 1.7–7.7)
Neutrophils Relative %: 63 %
Platelets: 91 10*3/uL — ABNORMAL LOW (ref 150–400)
RBC: 3.58 MIL/uL — ABNORMAL LOW (ref 4.22–5.81)
RDW: 13.3 % (ref 11.5–15.5)
WBC: 5 10*3/uL (ref 4.0–10.5)
nRBC: 0 % (ref 0.0–0.2)

## 2023-05-20 MED ORDER — NAPROXEN 375 MG PO TABS: 375.0000 mg | ORAL_TABLET | Freq: Two times a day (BID) | ORAL | 0 refills | Status: DC

## 2023-05-20 MED ORDER — IBUPROFEN 400 MG PO TABS
400.0000 mg | ORAL_TABLET | Freq: Once | ORAL | Status: AC
  Administered 2023-05-20: 400 mg via ORAL
  Filled 2023-05-20: qty 1

## 2023-05-20 MED ORDER — HYDROCODONE-ACETAMINOPHEN 5-325 MG PO TABS: 1.0000 | ORAL_TABLET | ORAL | 0 refills | Status: DC | PRN

## 2023-05-20 MED ORDER — HYDROCODONE-ACETAMINOPHEN 5-325 MG PO TABS
1.0000 | ORAL_TABLET | Freq: Once | ORAL | Status: AC
  Administered 2023-05-20: 1 via ORAL
  Filled 2023-05-20: qty 1

## 2023-05-20 NOTE — ED Provider Triage Note (Signed)
Emergency Medicine Provider Triage Evaluation Note  Jeffrey [name] , a 68 y.o. male  was evaluated in triage.  Pt complains of left leg swelling and edema for the past couple of days.  Denies any chest pain or shortness of breath.  Review of Systems  Positive: As above Negative: As above  Physical Exam  BP (!) 145/85 (BP Location: Left Arm)   Pulse 81   Temp 98.3 F (36.8 C) (Oral)   Resp 18   Ht 5\' 8"  (1.727 m)   Wt 48.1 kg   SpO2 100%   BMI 16.12 kg/m  Gen:   Awake, no distress   Resp:  Normal effort  MSK:   Moves extremities without difficulty  Other:  Tenderness palpation of the left calf and 1+ edema of the left lower extremity.  No edema of the right lower extremity.  Intact DP pulses  Medical Decision Making  Medically screening exam initiated at 2:00 PM.  Appropriate orders placed.  Unice Bailey was informed that the remainder of the evaluation will be completed by another provider, this initial triage assessment does not replace that evaluation, and the importance of remaining in the ED until their evaluation is complete.     Arabella Merles, PA-C 05/20/23 1401

## 2023-05-20 NOTE — Progress Notes (Unsigned)
68 year old, who has not seen his PCP in several years present complaining left leg worsening edema, pain, no significant redness on exam. He also relate seen spot left eye for many years.   -Patient was provided Transportation to Urgent Care for Doppler left lower extremity to rule out DVT. If negative for DVT could tx for cellulitis, or venous stasis.   -He will be refer to Ophthalmology for further evaluation of [place].  -PCP appointment will be arrange.

## 2023-05-20 NOTE — Discharge Instructions (Addendum)
No blood clots today.  He has a cyst behind the knee that needs to see a specialist.

## 2023-05-20 NOTE — ED Triage Notes (Signed)
Sent here by UC for increased left leg swelling.  Reports had a blood clot but hasn't been taking anything for it. Patient states "I dont need no blood thiner".

## 2023-05-20 NOTE — ED Provider Notes (Signed)
[place] EMERGENCY DEPARTMENT AT [facility] Provider Note   CSN: [id number] Arrival date & time: 05/20/23  1011     History  Chief Complaint  Patient presents with   Leg Swelling    Jeffrey [name] is a 68 y.o. male.  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  They sent him here to rule out DVT.  Patient is not taking any medications and is currently staying at [facility].  He does report walking a lot and does use a cane.  He is also noticed a little bit of swelling in his leg but is just very tender.  The history is provided by the patient.       Home Medications Prior to Admission medications   Medication Sig Start Date End Date Taking? Authorizing Provider  clobetasol ointment (TEMOVATE) 0.05 % Apply 1 application topically 2 (two) times daily. As needed to affected skin areas 03/02/18   Fulp, Cammie, MD  HYDROcodone-acetaminophen (NORCO/VICODIN) 5-325 MG tablet Take 1 tablet by mouth every 4 (four) hours as needed. 05/20/23   Gwyneth Sprout, MD  naproxen (NAPROSYN) 375 MG tablet Take 1 tablet (375 mg total) by mouth 2 (two) times daily. 05/20/23   Gwyneth Sprout, MD  predniSONE (DELTASONE) 20 MG tablet Take 1 tablet (20 mg total) by mouth 2 (two) times daily. 02/23/19   Mancel Bale, MD      Allergies    Patient has no known allergies.    Review of Systems   Review of Systems  Physical Exam Updated Vital Signs BP 133/84 (BP Location: Right Arm)   Pulse 71   Temp 99.1 F (37.3 C) (Oral)   Resp 16   Ht 5\' 8"  (1.727 m)   Wt 48.1 kg   SpO2 98%   BMI 16.12 kg/m  Physical Exam Vitals reviewed.  Constitutional:      Appearance: Normal appearance.  HENT:     Head: Normocephalic.  Cardiovascular:      Rate and Rhythm: Normal rate.     Pulses: Normal pulses.  Musculoskeletal:        General: Tenderness present.       Legs:     Comments: No significant distal edema in the ankles or feet.  Pulses are present bilaterally.  Skin:    General: Skin is warm and dry.  Neurological:     Mental Status: He is alert. Mental status is at baseline.     ED Results / Procedures / Treatments   Labs (all labs ordered are listed, but only abnormal results are displayed) Labs Reviewed  CBC WITH DIFFERENTIAL/PLATELET - Abnormal; Notable for the following components:      Result Value   RBC 3.58 (*)    Hemoglobin 11.7 (*)    HCT 36.0 (*)    MCV 100.6 (*)    Platelets 91 (*)    All other components within normal limits  COMPREHENSIVE METABOLIC PANEL - Abnormal; Notable for the following components:   Creatinine, Ser 1.48 (*)    Calcium 8.7 (*)    Albumin 3.4 (*)    AST 61 (*)    ALT 50 (*)    GFR, Estimated 52 (*)    All other components  within normal limits    EKG None  Radiology VAS [place] LOWER EXTREMITY VENOUS (DVT) (ONLY [facility] & [facility]) Result Date: 05/20/2023  Lower Venous DVT Study Patient Name:  SRIHAN BRUTUS  Date of Exam:   05/20/2023 Medical Rec #: [id number]       Accession #:    [id number] Date of Birth: October 25, 1955        Patient Gender: M Patient Age:   63 years Exam Location:  [facility] Procedure:      VAS [place] LOWER EXTREMITY VENOUS (DVT) Referring Phys: Alvino Blood --------------------------------------------------------------------------------  Indications: Swelling, Edema, and Pain.  Comparison Study: Previous study of right lower extremity venous duplex on                   2.19.2019. Performing Technologist: Fernande Bras  Examination Guidelines: A complete evaluation includes B-mode imaging, spectral Doppler, color Doppler, and power Doppler as needed of all accessible portions of each vessel. Bilateral testing is considered an integral part of a complete examination.  Limited examinations for reoccurring indications may be performed as noted. The reflux portion of the exam is performed with the patient in reverse Trendelenburg.  +-----+---------------+---------+-----------+----------+--------------+ RIGHTCompressibilityPhasicitySpontaneityPropertiesThrombus Aging +-----+---------------+---------+-----------+----------+--------------+ CFV  Full           Yes      Yes                                 +-----+---------------+---------+-----------+----------+--------------+ SFJ  Full           Yes      Yes                                 +-----+---------------+---------+-----------+----------+--------------+   +---------+---------------+---------+-----------+----------+--------------+ LEFT     CompressibilityPhasicitySpontaneityPropertiesThrombus Aging +---------+---------------+---------+-----------+----------+--------------+ CFV      Full           Yes      Yes                                 +---------+---------------+---------+-----------+----------+--------------+ SFJ      Full           Yes      Yes                                 +---------+---------------+---------+-----------+----------+--------------+ FV Prox  Full                                                        +---------+---------------+---------+-----------+----------+--------------+ FV Mid   Full                                                        +---------+---------------+---------+-----------+----------+--------------+ FV DistalFull                                                        +---------+---------------+---------+-----------+----------+--------------+  PFV      Full                                                        +---------+---------------+---------+-----------+----------+--------------+ POP      Full           Yes      Yes                                  +---------+---------------+---------+-----------+----------+--------------+ PTV      Full                                                        +---------+---------------+---------+-----------+----------+--------------+ PERO     Full                                                        +---------+---------------+---------+-----------+----------+--------------+ Complex looking structure/fluid collection noted in popliteal fossa measuring 4.3 x 4.5 x 1.8 cm.    Summary: RIGHT: - No evidence of common femoral vein obstruction.   LEFT: - There is no evidence of deep vein thrombosis in the lower extremity.  - A cystic structure is found in the popliteal fossa.  *See table(s) above for measurements and observations. Electronically signed by Heath Lark on 05/20/2023 at 5:54:53 PM.    Final     Procedures Procedures    Medications Ordered in ED Medications  HYDROcodone-acetaminophen (NORCO/VICODIN) 5-325 MG per tablet 1 tablet (has no administration in time range)  ibuprofen (ADVIL) tablet 400 mg (has no administration in time range)    ED Course/ Medical Decision Making/ A&P                                 Medical Decision Making Amount and/or Complexity of Data Reviewed Labs: ordered. Decision-making details documented in ED Course. Radiology: ordered and independent interpretation performed. Decision-making details documented in ED Course.  Risk Prescription drug management.   Patient presenting with persistent pain in his left knee.  Was sent here from the mobile clinic to evaluate for DVT.  Patient does report a prior history of DVT years ago.  He does not take any anticoagulation at this time.  He denies any trauma.  He reports his whole knee hurts.  He is using a cane to walk.  On exam patient has no erythema or warmth or redness noted to the knee.  He is able to flex and extend and low suspicion for a septic joint.  I have independently visualized and interpreted pt's  images today. Doppler is negative for DVT.  However  radiology does note that patient has a cystic structure in his popliteal fossa.  Suspect patient has a Baker's cyst causing some of his pain.  No evidence of cellulitis today.  No signs of ischemic limb.  I independently interpreted patient's labs and CMP with creatinine of  1.48 without recent to compare most likely baseline and CBC without acute findings.  At this time patient appears stable for discharge and follow-up with orthopedics.  He was given pain control.        Final Clinical Impression(s) / ED Diagnoses Final diagnoses:  Baker's cyst of knee, left    Rx / DC Orders ED Discharge Orders          Ordered    naproxen (NAPROSYN) 375 MG tablet  2 times daily,   Status:  Discontinued        05/20/23 1917    HYDROcodone-acetaminophen (NORCO/VICODIN) 5-325 MG tablet  Every 4 hours PRN,   Status:  Discontinued        05/20/23 1917    HYDROcodone-acetaminophen (NORCO/VICODIN) 5-325 MG tablet  Every 4 hours PRN        05/20/23 1918    naproxen (NAPROSYN) 375 MG tablet  2 times daily        05/20/23 1918              Gwyneth Sprout, MD 05/20/23 1919

## 2023-05-24 ENCOUNTER — Encounter: Payer: Self-pay | Admitting: *Deleted

## 2023-05-24 NOTE — Congregational Nurse Program (Signed)
  Dept: [phone]   Congregational Nurse Program Note  Date of Encounter: 05/24/2023  Past Medical History: Past Medical History:  Diagnosis Date   Cataract    Glaucoma    Hearing deficit    Hepatitis C    Renal disorder     Encounter Details:  Community Questionnaire - 05/24/23 1104       Questionnaire   Ask client: Do you give verbal consent for me to treat you today? Yes    Student Assistance N/A    Location Patient Served  [facility]    Encounter Setting CN site;Phone/Text/Email    Population Status [employer];Medicare    Insurance/Financial Assistance Referral N/A    Medication Provided Medication Assistance;Have Medication Insecurities    Medical Provider No    Screening Referrals Made N/A    Medical Referrals Made [facility]    Medical Appointment Completed N/A    CNP Interventions Advocate/Support;Navigate Healthcare System;Case Management    Screenings CN Performed N/A    ED Visit Averted N/A    Life-Saving Intervention Made N/A           Called and set up appointments per MD request. Appointment [facility] [address] [place] [address] Wednesday February 19th !:00 with Jodelle Red PA. Appointment [facility] [address] [facility] April 17th 10:10 with Gwinda Passe for PCP. Called Julieanne Manson MD office first as listed in epic. Per call, client has never been seen there and no appointments available until June. Gave bus passes for transportation and information given to SW to assist with transportation when available.  Nnaemeka Samson W RN CN

## 2023-05-27 ENCOUNTER — Other Ambulatory Visit: Payer: Self-pay | Admitting: Critical Care Medicine

## 2023-05-27 ENCOUNTER — Encounter: Payer: Self-pay | Admitting: Critical Care Medicine

## 2023-05-27 DIAGNOSIS — M7122 Synovial cyst of popliteal space [Baker], left knee: Secondary | ICD-10-CM

## 2023-05-27 MED ORDER — PREDNISONE 10 MG PO TABS: ORAL_TABLET | ORAL | 0 refills | Status: DC

## 2023-05-27 NOTE — Progress Notes (Signed)
 Pred for baker cyst
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
## 2023-06-03 ENCOUNTER — Other Ambulatory Visit: Payer: Self-pay

## 2023-06-03 ENCOUNTER — Other Ambulatory Visit: Payer: Self-pay | Admitting: Physician Assistant

## 2023-06-03 ENCOUNTER — Encounter: Payer: Self-pay | Admitting: *Deleted

## 2023-06-03 ENCOUNTER — Telehealth: Payer: Self-pay | Admitting: Critical Care Medicine

## 2023-06-03 ENCOUNTER — Encounter: Payer: Self-pay | Admitting: Physician Assistant

## 2023-06-03 DIAGNOSIS — I739 Peripheral vascular disease, unspecified: Secondary | ICD-10-CM

## 2023-06-03 MED ORDER — ATORVASTATIN CALCIUM 20 MG PO TABS
20.0000 mg | ORAL_TABLET | Freq: Every day | ORAL | 11 refills | Status: DC
  Filled 2023-06-03: qty 30, 30d supply, fill #0

## 2023-06-03 NOTE — Progress Notes (Signed)
 Pt having problems w/ LLE swelling.  Has been going on for about 2 months. Was in the ER on 2/13, [place] neg for DVT.  Has pain in his L calf, his L leg is tender. It hurts and swells. Has decreased L DP, L PT and L popliteal are 2+, RLE pulses are 2+.   Will need referral to [facility] for ABIs.  Never had cholesterol tested. Based on vascular dz by exam, will add Lipitor 20 mg and ASA 81 mg.   Has appt with Dr Delford Field on 03/13 @ 8:50 am.   LFTs were slightly elevated when last checked, will need repeat check and lipid profile.     06/03/2023   12:01 PM 05/20/2023    6:15 PM 05/20/2023    4:51 PM  Vitals with BMI  Systolic 148 133 213  Diastolic 78 84 79  Pulse 103 71 73   VS checked in both arms, no sig difference (both elevated). Will hold off on starting BP rx, Compliance will improve if he does not get too many at once.    Theodore Demark, PA-C 06/03/2023 12:08 PM

## 2023-06-03 NOTE — Congregational Nurse Program (Signed)
  Dept: [phone]   Congregational Nurse Program Note  Date of Encounter: 06/03/2023  Past Medical History: Past Medical History:  Diagnosis Date   Cataract    Glaucoma    Hearing deficit    Hepatitis C    Renal disorder     Encounter Details:  Community Questionnaire - 06/03/23 1521       Questionnaire   Ask client: Do you give verbal consent for me to treat you today? N/A    Student Assistance N/A    Location Patient Served  [facility]    Encounter Setting CN site    Population Status Unhoused    Insurance Medicaid;Medicare    Insurance/Financial Assistance Referral N/A    Medication Have Medication Insecurities;Patient Medications Reviewed    Medical Provider Yes    Screening Referrals Made N/A    Medical Referrals Made N/A    Medical Appointment Completed [facility]    CNP Interventions Advocate/Support;Case Management    Screenings CN Performed N/A    ED Visit Averted N/A    Life-Saving Intervention Made N/A            Received request to p/u medication from [facility] pharmacy. Picked up medication and brought to [facility]. Could not locate client. Left medication with front desk staff including name and bed number. Chenay Nesmith W RN CN

## 2023-06-03 NOTE — Telephone Encounter (Signed)
 Received all from [facility] clinic  patient to be established with me 06/17/23 Has reduced pulses both Lower extremities  Needs vascular assessment   Ref sent

## 2023-06-04 ENCOUNTER — Ambulatory Visit: Payer: 59 | Admitting: Orthopaedic Surgery

## 2023-06-05 ENCOUNTER — Other Ambulatory Visit: Payer: Self-pay

## 2023-06-05 ENCOUNTER — Encounter (HOSPITAL_COMMUNITY): Payer: Self-pay | Admitting: Emergency Medicine

## 2023-06-05 ENCOUNTER — Emergency Department (HOSPITAL_COMMUNITY)
Admission: EM | Admit: 2023-06-05 | Discharge: 2023-06-06 | Discharge status: Against Medical Advice | Attending: Emergency Medicine | Admitting: Emergency Medicine

## 2023-06-05 DIAGNOSIS — R059 Cough, unspecified: Secondary | ICD-10-CM | POA: Insufficient documentation

## 2023-06-05 DIAGNOSIS — Z5321 Procedure and treatment not carried out due to patient leaving prior to being seen by health care provider: Secondary | ICD-10-CM | POA: Diagnosis not present

## 2023-06-05 LAB — RESP PANEL BY RT-PCR (RSV, FLU A&B, COVID)  RVPGX2
Influenza A by PCR: NEGATIVE
Influenza B by PCR: NEGATIVE
Resp Syncytial Virus by PCR: NEGATIVE
SARS Coronavirus 2 by RT PCR: NEGATIVE
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
## 2023-06-05 NOTE — ED Notes (Signed)
 Pt seen placing a cigarette in his mouth and was seen exiting the department.

## 2023-06-05 NOTE — ED Notes (Signed)
 Pt left AMA. Pt seen walking out of ED saying" this shit is ridiculous, been here all night, I'm reporting this"

## 2023-06-05 NOTE — ED Triage Notes (Signed)
 Pt [facility] with complaints of flu-like sx and some coughing for several days. Endorses some green colored mucus. A&Ox4 at this time. Denies CP or SOB.

## 2023-06-06 DIAGNOSIS — R531 Weakness: Secondary | ICD-10-CM | POA: Diagnosis not present

## 2023-06-06 DIAGNOSIS — R059 Cough, unspecified: Secondary | ICD-10-CM | POA: Diagnosis not present

## 2023-06-06 DIAGNOSIS — R6889 Other general symptoms and signs: Secondary | ICD-10-CM | POA: Diagnosis not present

## 2023-06-06 DIAGNOSIS — R404 Transient alteration of awareness: Secondary | ICD-10-CM | POA: Diagnosis not present

## 2023-06-09 ENCOUNTER — Encounter: Payer: Self-pay | Admitting: Physician Assistant

## 2023-06-09 ENCOUNTER — Encounter: Payer: Self-pay | Admitting: *Deleted

## 2023-06-09 NOTE — Congregational Nurse Program (Signed)
  Dept: [phone]   Congregational Nurse Program Note  Date of Encounter: 06/09/2023  Past Medical History: Past Medical History:  Diagnosis Date   Cataract    Glaucoma    Hearing deficit    Hepatitis C    Renal disorder     Encounter Details:  Community Questionnaire - 06/09/23 1306       Questionnaire   Ask client: Do you give verbal consent for me to treat you today? Yes    Student Assistance N/A    Location Patient Served  [facility]    Encounter Setting CN site    Population Status Unhoused    Insurance Medicaid;Medicare    Insurance/Financial Assistance Referral N/A    Medication N/A    Medical Provider Yes    Screening Referrals Made N/A    Medical Referrals Made [facility]    Medical Appointment Completed N/A    CNP Interventions Advocate/Support    Screenings CN Performed Blood Pressure    ED Visit Averted N/A    Life-Saving Intervention Made N/A            Found client at [facility] to f/u on rt leg abrasion and bandage change. Rt leg has scab with no drainage or redness. Completed triage for to see MD at [facility] clinic today for assessment of rt leg and need for any more bandages. Kawon Willcutt W RN CN

## 2023-06-09 NOTE — Progress Notes (Unsigned)
 Leg wound looks good, dressing was applied last Thurs and pt removed it over the weekend.  There is a scab present, mild erythema, edema, and tenderness along the wound. Wound is narrow, 6.5 cm in length.   Pulses have been weak in the past,   His knee is doing better, no longer swollen and no sig pain.  Swelling has gone down in both legs, just has a sock mark mid-tib.  Has glaucoma, not on drops right now. Needs Eye MD appt. Also has had eye trauma in the past, only eye gtts in our system are tobramycin 0.3% in 2019.   He has vision deficits, ?floaters as well. [facility] will be contacted for appt.   Poor dentition, needs dental work.

## 2023-06-10 ENCOUNTER — Encounter: Payer: Self-pay | Admitting: *Deleted

## 2023-06-10 ENCOUNTER — Encounter: Payer: Self-pay | Admitting: Critical Care Medicine

## 2023-06-10 NOTE — Congregational Nurse Program (Signed)
  Dept: [phone]   Congregational Nurse Program Note  Date of Encounter: 06/10/2023  Past Medical History: Past Medical History:  Diagnosis Date   Cataract    Glaucoma    Hearing deficit    Hepatitis C    Renal disorder     Encounter Details:  Community Questionnaire - 06/10/23 1531       Questionnaire   Ask client: Do you give verbal consent for me to treat you today? Yes    Student Assistance [employer] Nurse    Location Patient Served  [facility]    Encounter Setting CN site    Population Status Unhoused    Insurance Medicaid;Medicare    Insurance/Financial Assistance Referral N/A    Medication N/A    Medical Provider Yes    Screening Referrals Made N/A    Medical Referrals Made [facility]    Medical Appointment Completed N/A    CNP Interventions Advocate/Support    Screenings CN Performed N/A    ED Visit Averted N/A    Life-Saving Intervention Made N/A           Appointment made for [facility] [address] [place] [address] March 7th 1:00 with Dr Nedra Hai per [facility] MD request.  Information given to [facility] SW Leta Jungling to schedule transportation. Writer could not locate client at shelter. Leaving information about appointment with front desk staff to give client. Dannie Hattabaugh W RN CN

## 2023-06-10 NOTE — Progress Notes (Signed)
 This patient seen in the [facility] clinic the patient is scheduled to see me on March 13 in the clinic and today is seen in follow-up from visit last week.  He injured his right lower extremity with an vertical wound which is slowly healing he has had wound coverage and Kerlix wrap to the wound today the wound does not appear to be infected it is scabbing over he is asked to continue dressings for another 4 to 5 days and then this could likely be discontinued in addition there is a concern for peripheral artery disease and we will endeavor to get him a vascular surgery appointment and do an ABI screening when his visit occurs  Another concern is that he has had decreased vision and prior history of glaucoma he is not on current therapy and we have requested an urgent visit with [facility] to see this patient who does have [place] healthcare Medicare Medicaid  Patient also has severe dental condition which will need to be addressed  

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
## 2023-06-14 ENCOUNTER — Encounter: Payer: Self-pay | Admitting: *Deleted

## 2023-06-14 NOTE — Congregational Nurse Program (Signed)
  Dept: [phone]   Congregational Nurse Program Note  Date of Encounter: 06/14/2023  Past Medical History: Past Medical History:  Diagnosis Date   Cataract    Glaucoma    Hearing deficit    Hepatitis C    Renal disorder     Encounter Details:  Community Questionnaire - 06/14/23 1156       Questionnaire   Ask client: Do you give verbal consent for me to treat you today? Yes    Student Assistance N/A    Location Patient Served  [facility]    Encounter Setting CN site;Phone/Text/Email    Population Status [employer];Medicare    Insurance/Financial Assistance Referral N/A    Medication N/A    Medical Provider Yes    Screening Referrals Made N/A    Medical Referrals Made [facility]    Medical Appointment Completed N/A    CNP Interventions Advocate/Support    Screenings CN Performed Blood Pressure    ED Visit Averted N/A    Life-Saving Intervention Made N/A            Client reports he missed his recent eye appt with [facility]. Writer contacted Dr Nedra Hai again and made appt for today. Gave client two bus passes for transportation. Changed bandage on rt lower leg per NP recommendation. Scab on rt leg with no redness or drainage. Blood pressure 133/69, pulse 94, SpO2 97%. Offered support and encouragement. Zykira Matlack W RN CN

## 2023-06-16 ENCOUNTER — Encounter: Payer: Self-pay | Admitting: Physician Assistant

## 2023-06-16 NOTE — Progress Notes (Signed)
 Pt here today for wound care and help w/ appts.  His leg wound is not sig changed from last week, mild erythema around wound, no S&S infection, no sig swelling.  Dressing changed.  Pt has small abrasion outer R knee after a fall. Bandaid w/ ABX cream applied.    Gilmer Mor is not adequate for his ambulation. Pt given a rollator.   Pt having R knee pain after his fall, all areas are tender on exam, but there is no crepitus and only mild effusion.  Pt given ibuprofen 200 mg, told he could take 2 tabs bid.   No other issues or concerns.   Pt has appt w/ PW in am at 8:50, he is to ride the bus. Says he will leave right after breakfast.   Pt has appt 3/17 at 2:30 pm w/ Dr Sherryll Burger, Ophthalmologist.   He has hearing problems, has had them since childhood. Needs referral to hearing MD, will leave that to Dr Delford Field to arrange.   Theodore Demark, PA-C 06/16/2023 2:16 PM

## 2023-06-17 ENCOUNTER — Other Ambulatory Visit: Payer: Self-pay

## 2023-06-17 ENCOUNTER — Encounter: Payer: Self-pay | Admitting: Critical Care Medicine

## 2023-06-17 ENCOUNTER — Ambulatory Visit: Payer: 59 | Attending: Critical Care Medicine | Admitting: Critical Care Medicine

## 2023-06-17 VITALS — BP 117/74 | HR 70 | Resp 19 | Ht 68.0 in | Wt 111.4 lb

## 2023-06-17 DIAGNOSIS — H269 Unspecified cataract: Secondary | ICD-10-CM | POA: Insufficient documentation

## 2023-06-17 DIAGNOSIS — Z5982 Transportation insecurity: Secondary | ICD-10-CM | POA: Insufficient documentation

## 2023-06-17 DIAGNOSIS — G8929 Other chronic pain: Secondary | ICD-10-CM

## 2023-06-17 DIAGNOSIS — K089 Disorder of teeth and supporting structures, unspecified: Secondary | ICD-10-CM

## 2023-06-17 DIAGNOSIS — F17210 Nicotine dependence, cigarettes, uncomplicated: Secondary | ICD-10-CM | POA: Insufficient documentation

## 2023-06-17 DIAGNOSIS — D649 Anemia, unspecified: Secondary | ICD-10-CM | POA: Insufficient documentation

## 2023-06-17 DIAGNOSIS — M170 Bilateral primary osteoarthritis of knee: Secondary | ICD-10-CM | POA: Diagnosis not present

## 2023-06-17 DIAGNOSIS — R0602 Shortness of breath: Secondary | ICD-10-CM | POA: Insufficient documentation

## 2023-06-17 DIAGNOSIS — M25561 Pain in right knee: Secondary | ICD-10-CM | POA: Diagnosis not present

## 2023-06-17 DIAGNOSIS — R600 Localized edema: Secondary | ICD-10-CM | POA: Insufficient documentation

## 2023-06-17 DIAGNOSIS — Z5901 Sheltered homelessness: Secondary | ICD-10-CM | POA: Diagnosis not present

## 2023-06-17 DIAGNOSIS — F1029 Alcohol dependence with unspecified alcohol-induced disorder: Secondary | ICD-10-CM

## 2023-06-17 DIAGNOSIS — N189 Chronic kidney disease, unspecified: Secondary | ICD-10-CM

## 2023-06-17 DIAGNOSIS — R296 Repeated falls: Secondary | ICD-10-CM

## 2023-06-17 DIAGNOSIS — H9192 Unspecified hearing loss, left ear: Secondary | ICD-10-CM

## 2023-06-17 DIAGNOSIS — M7122 Synovial cyst of popliteal space [Baker], left knee: Secondary | ICD-10-CM | POA: Insufficient documentation

## 2023-06-17 DIAGNOSIS — E7800 Pure hypercholesterolemia, unspecified: Secondary | ICD-10-CM | POA: Insufficient documentation

## 2023-06-17 DIAGNOSIS — Z79899 Other long term (current) drug therapy: Secondary | ICD-10-CM | POA: Insufficient documentation

## 2023-06-17 DIAGNOSIS — C220 Liver cell carcinoma: Secondary | ICD-10-CM | POA: Insufficient documentation

## 2023-06-17 DIAGNOSIS — Z720 Tobacco use: Secondary | ICD-10-CM

## 2023-06-17 DIAGNOSIS — M25562 Pain in left knee: Secondary | ICD-10-CM

## 2023-06-17 DIAGNOSIS — K7030 Alcoholic cirrhosis of liver without ascites: Secondary | ICD-10-CM | POA: Insufficient documentation

## 2023-06-17 DIAGNOSIS — H409 Unspecified glaucoma: Secondary | ICD-10-CM | POA: Diagnosis not present

## 2023-06-17 DIAGNOSIS — B182 Chronic viral hepatitis C: Secondary | ICD-10-CM

## 2023-06-17 DIAGNOSIS — Z139 Encounter for screening, unspecified: Secondary | ICD-10-CM | POA: Diagnosis not present

## 2023-06-17 DIAGNOSIS — Z5941 Food insecurity: Secondary | ICD-10-CM | POA: Insufficient documentation

## 2023-06-17 NOTE — Assessment & Plan Note (Signed)
 Given counseling for alcohol reduction abuse

## 2023-06-17 NOTE — Assessment & Plan Note (Signed)
 Referral ophthalmology

## 2023-06-17 NOTE — Assessment & Plan Note (Signed)
 Now has a rollator will make referral to physical therapy

## 2023-06-17 NOTE — Assessment & Plan Note (Signed)
 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

## 2023-06-17 NOTE — Assessment & Plan Note (Addendum)
 Referral to orthopedics Patient has a wound on right knee from recent falling its superficial abrasion will place bandage and also a vertical injury to the right lower extremity we will continue to dress but it is healing quickly

## 2023-06-17 NOTE — Assessment & Plan Note (Signed)
 Needs evaluation by ophthalmology referral made

## 2023-06-17 NOTE — Assessment & Plan Note (Signed)
Referral to dentistry made

## 2023-06-17 NOTE — Assessment & Plan Note (Signed)
 Currently in homeless shelter

## 2023-06-17 NOTE — Assessment & Plan Note (Signed)
 Hepatitis C with prior hepatoma will need further assessment referral made to hepatitis C clinic may yet need abdominal ultrasound

## 2023-06-17 NOTE — Assessment & Plan Note (Signed)
    Current smoking consumption amount: 3 6 cigarettes daily  Dicsussion on advise to quit smoking and smoking impacts: Cardiovascular impact lung impact  Patient's willingness to quit: Cannot quit now due to stress at the homeless shelter  Methods to quit smoking discussed: Behavioral therapy nicotine replacement  Medication management of smoking session drugs discussed: Nicotine replacement  Resources provided:  AVS   Setting quit date not established  Follow-up arranged 4 months   Time spent counseling the patient: 5 minutes

## 2023-06-17 NOTE — Assessment & Plan Note (Signed)
Reassess labs 

## 2023-06-17 NOTE — Patient Instructions (Addendum)
 Lab obtained  Referral to orthopedics will be made  Referral to physical therapy will be made for frequent falls    Dental referral  Eye referral

## 2023-06-17 NOTE — Assessment & Plan Note (Signed)
 This is a chronic problem there is no cerumen in the ear canals eventually he will need audiology assess

## 2023-06-19 ENCOUNTER — Encounter: Payer: Self-pay | Admitting: Critical Care Medicine

## 2023-06-19 ENCOUNTER — Other Ambulatory Visit: Payer: Self-pay | Admitting: Critical Care Medicine

## 2023-06-19 DIAGNOSIS — B182 Chronic viral hepatitis C: Secondary | ICD-10-CM

## 2023-06-19 LAB — CBC WITH DIFFERENTIAL/PLATELET
Basophils Absolute: 0 10*3/uL (ref 0.0–0.2)
Basos: 1 %
EOS (ABSOLUTE): 0.2 10*3/uL (ref 0.0–0.4)
Eos: 4 %
Hematocrit: 33.2 % — ABNORMAL LOW (ref 37.5–51.0)
Hemoglobin: 11 g/dL — ABNORMAL LOW (ref 13.0–17.7)
Immature Grans (Abs): 0 10*3/uL (ref 0.0–0.1)
Immature Granulocytes: 1 %
Lymphocytes Absolute: 1.5 10*3/uL (ref 0.7–3.1)
Lymphs: 29 %
MCH: 32.8 pg (ref 26.6–33.0)
MCHC: 33.1 g/dL (ref 31.5–35.7)
MCV: 99 fL — ABNORMAL HIGH (ref 79–97)
Monocytes Absolute: 0.7 10*3/uL (ref 0.1–0.9)
Monocytes: 13 %
Neutrophils Absolute: 2.8 10*3/uL (ref 1.4–7.0)
Neutrophils: 52 %
Platelets: 157 10*3/uL (ref 150–450)
RBC: 3.35 x10E6/uL — ABNORMAL LOW (ref 4.14–5.80)
RDW: 12.8 % (ref 11.6–15.4)
WBC: 5.4 10*3/uL (ref 3.4–10.8)

## 2023-06-19 LAB — COMPREHENSIVE METABOLIC PANEL
ALT: 33 IU/L (ref 0–44)
AST: 41 IU/L — ABNORMAL HIGH (ref 0–40)
Albumin: 4 g/dL (ref 3.9–4.9)
Alkaline Phosphatase: 110 IU/L (ref 44–121)
BUN/Creatinine Ratio: 13 (ref 10–24)
BUN: 22 mg/dL (ref 8–27)
Bilirubin Total: 0.2 mg/dL (ref 0.0–1.2)
CO2: 19 mmol/L — ABNORMAL LOW (ref 20–29)
Calcium: 9.1 mg/dL (ref 8.6–10.2)
Chloride: 105 mmol/L (ref 96–106)
Creatinine, Ser: 1.65 mg/dL — ABNORMAL HIGH (ref 0.76–1.27)
Globulin, Total: 3.1 g/dL (ref 1.5–4.5)
Glucose: 90 mg/dL (ref 70–99)
Potassium: 4.4 mmol/L (ref 3.5–5.2)
Sodium: 139 mmol/L (ref 134–144)
Total Protein: 7.1 g/dL (ref 6.0–8.5)
eGFR: 46 mL/min/{1.73_m2} — ABNORMAL LOW (ref >59)

## 2023-06-19 LAB — HCV RT-PCR, QUANT (NON-GRAPH)
HCV log10: 6.563 {Log_IU}/mL
Hepatitis C Quantitation: 3660000 [IU]/mL

## 2023-06-19 LAB — LIPID PANEL
Chol/HDL Ratio: 2.6 ratio (ref 0.0–5.0)
Cholesterol, Total: 113 mg/dL (ref 100–199)
HDL: 44 mg/dL (ref >39)
LDL Chol Calc (NIH): 48 mg/dL (ref 0–99)
Triglycerides: 117 mg/dL (ref 0–149)
VLDL Cholesterol Cal: 21 mg/dL (ref 5–40)

## 2023-06-19 LAB — HCV AB W REFLEX TO QUANT PCR: HCV Ab: REACTIVE — AB

## 2023-06-19 NOTE — Progress Notes (Signed)
 Let patient know kidney function is stable liver has mild inflammation and his hepatitis C is positive he has active infection and we will refer him to hepatitis C clinic  Blood counts are stable cholesterol is normal

## 2023-06-21 ENCOUNTER — Encounter: Payer: Self-pay | Admitting: *Deleted

## 2023-06-21 NOTE — Congregational Nurse Program (Signed)
  Dept: [phone]   Congregational Nurse Program Note  Date of Encounter: 06/21/2023  Past Medical History: Past Medical History:  Diagnosis Date   Cataract    Glaucoma    Hearing deficit    Hepatitis C    Renal disorder     Encounter Details:  Community Questionnaire - 06/21/23 0944       Questionnaire   Ask client: Do you give verbal consent for me to treat you today? Yes    Student Assistance N/A    Location Patient Served  [facility]    Encounter Setting CN site    Population Status Unhoused    Insurance Medicaid;Medicare    Insurance/Financial Assistance Referral N/A    Medication N/A    Medical Provider Yes    Screening Referrals Made N/A    Medical Referrals Made N/A    Medical Appointment Completed N/A    CNP Interventions Advocate/Support;Case Management    Screenings CN Performed Blood Pressure    ED Visit Averted N/A    Life-Saving Intervention Made N/A            Client came to nurse's office for wound care dressing change on rt lower leg and rt knee. Dressing changed with no redness, drainage or signs of infection. Checked vitals. Blood pressure (!) 144/81, pulse 92, SpO2 98%. Client reports he is taking medications as prescribed. Reminded client of upcoming ophthalmology appointment today at 2:30 with [facility].. Bus passes given and client has written information about appointment. He acknowledges understanding.  Selita Staiger W RN CN

## 2023-06-22 ENCOUNTER — Ambulatory Visit: Admitting: Orthopaedic Surgery

## 2023-06-23 ENCOUNTER — Encounter: Payer: Self-pay | Admitting: Physician Assistant

## 2023-06-23 NOTE — Progress Notes (Signed)
 Pt did not make it to they eye MD, the bus dropped him off, but he would have had to walk 4 blocks and then make it down a hill.  He does not ambulate well enough to walk that far, came back to the shelter.   He will need to be rescheduled and go by [employer].  His leg wound is healing well, no longer needs Xeroform gauze. Dry dressing applied.   No other issues or concerns.  Theodore Demark, PA-C 06/23/2023 3:00 PM

## 2023-06-24 ENCOUNTER — Telehealth: Payer: Self-pay

## 2023-06-24 ENCOUNTER — Encounter: Payer: Self-pay | Admitting: *Deleted

## 2023-06-24 ENCOUNTER — Other Ambulatory Visit (HOSPITAL_COMMUNITY): Payer: Self-pay

## 2023-06-24 NOTE — Telephone Encounter (Signed)
 [facility] Patient Advocate Encounter  Insurance verification completed.    The patient is insured through [employer].     Ran test claim for EPCLUSA   Medication will need a PA. (NON FORMULARY)   Ran test claim for MAVYRET   Medication will need a PA. (PREFERRED)   We will continue to follow to see if copay assistance is needed.  This test claim was processed through [facility]- copay amounts may vary at other pharmacies due to pharmacy/plan contracts, or as the patient moves through the different stages of their insurance plan.

## 2023-06-24 NOTE — Congregational Nurse Program (Signed)
  Dept: [phone]   Congregational Nurse Program Note  Date of Encounter: 06/24/2023  Past Medical History: Past Medical History:  Diagnosis Date   Cataract    Glaucoma    Hearing deficit    Hepatitis C    Renal disorder     Encounter Details:  Community Questionnaire - 06/24/23 1444       Questionnaire   Ask client: Do you give verbal consent for me to treat you today? N/A    Student Assistance [employer] Nurse    Location Patient Served  [facility]    Encounter Setting CN site;Phone/Text/Email    Population Status [employer];Medicare    Insurance/Financial Assistance Referral N/A    Medication N/A    Medical Provider Yes    Screening Referrals Made N/A    Medical Appointment Completed N/A    CNP Interventions Advocate/Support    Screenings CN Performed N/A    ED Visit Averted N/A    Life-Saving Intervention Made N/A            Received message from MD that client needed another appointment with [facility]. Made an appointment for April 4th 12:30 with Dr. Nada Libman [facility] [address]. Transportation request given to SW. Joselyne Spake W RN CN

## 2023-07-05 ENCOUNTER — Encounter: Payer: Self-pay | Admitting: *Deleted

## 2023-07-05 NOTE — Congregational Nurse Program (Signed)
  Dept: [phone]   Congregational Nurse Program Note  Date of Encounter: 07/05/2023  Past Medical History: Past Medical History:  Diagnosis Date   Cataract    Glaucoma    Hearing deficit    Hepatitis C    Renal disorder     Encounter Details:  Community Questionnaire - 07/05/23 1033       Questionnaire   Ask client: Do you give verbal consent for me to treat you today? Yes    Student Assistance N/A    Location Patient Served  [facility]    Encounter Setting CN site    Population Status Unhoused    Insurance Medicaid;Medicare    Insurance/Financial Assistance Referral N/A    Medication N/A    Medical Provider Yes    Screening Referrals Made N/A    Medical Referrals Made N/A    Medical Appointment Completed N/A    CNP Interventions Advocate/Support;Case Management    Screenings CN Performed N/A    ED Visit Averted N/A    Life-Saving Intervention Made N/A            Client seen at [facility]. Information written and given about upcoming appointments Wednesday April 2nd [facility] 0900 and [facility]. April 4th 12:30. [facility] SW is assisting with transportation to [facility]. Client is to see her Friday morning to prepare for transportation.  Mahmoud Blazejewski W RN CN

## 2023-07-05 NOTE — Progress Notes (Signed)
 Done in triage

## 2023-07-07 ENCOUNTER — Ambulatory Visit: Admitting: Internal Medicine

## 2023-07-07 ENCOUNTER — Encounter: Payer: Self-pay | Admitting: Internal Medicine

## 2023-07-07 ENCOUNTER — Other Ambulatory Visit: Payer: Self-pay

## 2023-07-07 VITALS — BP 132/81 | HR 99 | Temp 98.1°F | Wt 113.0 lb

## 2023-07-07 DIAGNOSIS — B182 Chronic viral hepatitis C: Secondary | ICD-10-CM | POA: Diagnosis not present

## 2023-07-07 NOTE — Progress Notes (Signed)
 [facility]                                      [address], [place], [place], [address]                                               Phn. [phone]; Fax: [phone]                                                               Date:  Reason for Visit: Hepatitis C    HPI: Jeffrey [name] is a 68 y.o.old male with history of chronic hepatitis C.  Viral load in 2011 was 2.16 million.  Repeat testing on 06/17/2023 showed 3.66 million hepatitis C viral load..  Insurance is staying at a homeless shelter.  He was seen by PCP in March 2025 and referred to infectious disease due to history hepatitis C.  He also has a history of alcohol abuse.  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  Denies  blood transfusion, sharing of toothbrushes/razors, or sexual contact with known positive partners, Financial planner.He has not received treatment to date   Denies any hospitalizations related to liver disease, jaundice, ascites, GI bleeding, mental status changes, abdominal pain and acholic stool.   ROS: Denies yellowish discoloration of sclera and skin, abdominal pain/distension, hematemesis.  Denis cough, fever, chills, nightsweats, nausea, vomiting, diarrhea, constipation, weight loss, recent hospitalizations, rashes, joint complaints, shortness of breath, chest pain, headaches, dysuria .   Current Outpatient Medications on File Prior to Visit  Medication Sig Dispense Refill   atorvastatin (LIPITOR) 20 MG tablet Take 1 tablet (20 mg total) by mouth daily. (Patient not taking: Reported on 07/07/2023) 30 tablet 11   No current facility-administered medications on file prior to visit.   No Known Allergies  Past Medical History:  Diagnosis Date   Cataract    Glaucoma    Hearing deficit    Hepatitis C    Renal disorder     No past surgical history  on file.  Social History   Socioeconomic History   Marital status: Divorced    Spouse name: Not on file   Number of children: Not on file   Years of education: Not on file   Highest education level: Not on file  Occupational History   Not on file  Tobacco Use   Smoking status: Every Day    Current packs/day: 0.25    Types: Cigarettes   Smokeless tobacco: Never  Substance and Sexual Activity   Alcohol use: Yes    Comment: daily    Drug use: No   Sexual activity: Not on file  Other Topics Concern   Not on file  Social History Narrative   Not on file   Social Drivers of Health   Financial Resource Strain: Not on file  Food Insecurity: Food Insecurity Present (06/17/2023)   Hunger Vital Sign    Worried About Running Out of Food  in the Last Year: Often true    Ran Out of Food in the Last Year: Sometimes true  Transportation Needs: Unmet Transportation Needs (06/17/2023)   PRAPARE - Administrator, Civil Service (Medical): Yes    Lack of Transportation (Non-Medical): Yes  Physical Activity: Not on file  Stress: Not on file  Social Connections: Not on file  Intimate Partner Violence: Not At Risk (06/17/2023)   Humiliation, Afraid, Rape, and Kick questionnaire    Fear of Current or Ex-Partner: No    Emotionally Abused: No    Physically Abused: No    Sexually Abused: No    Family History  Problem Relation Age of Onset   Cancer Mother    Cancer Sister     Physical exam: BP 132/81   Pulse 99   Temp 98.1 F (36.7 C) (Oral)   Wt 113 lb (51.3 kg)   SpO2 99%   BMI 17.18 kg/m   Gen: Alert and oriented x 3, no acute distress HEENT: [place]/AT, PERL, EOMI, no scleral icterus, no pale conjunctivae, hearing normal, oral mucosa moist Neck: Supple, no lymphadenopathy Cardio: Regular rate and rhythm; +S1 and S2; no murmurs, gallops, or rubs Resp: CTAB; no wheezes, rhonchi, or rales GI: Soft, nontender, nondistended, bowel sounds present GU: Musc: Extremities: No  cyanosis, clubbing, or edema; +2 PT and DP pulses Skin: No rashes, lesions, or ecchymoses Neuro: No focal deficits Psych: Calm, cooperative   Assessment/Plan:  #Hepatitis C # Remote Hx HCV Prior treatment:  none GT: Evidence of cirrhosis:  Interested in treatment: Potential DDI Plan: Labs and U/S CBC w diff CMP PT/PTT/INR Hep A and B serologies  HIV HCV genotype HCV RNA Measure of fibrosis NS5A resistance testing in select scenarios F/U with pharmacy to start medication, myself at the end of treatment He is at the shelter atleast till 08/17/23, he will ask to renew shelter stay while on hcv meds.  #Substance use? Smoking Alcohol Illicit substance use    #Counseling done on the following: -Natural progression of hep c, transmission (avoid sharing personal hygiene equipment), prevention, risks of left untreated and treatment options  -Avoid hepatotoxins like alcohol and excessive acetamaminphen (no more than 2 gram a day) -Avoid eating raw sea food -Risks of re-infection  -Hepatitis coinfection and vaccination( Pneumococcal vaccination in the cirrhotics   #If indicated in the setting of cirrhosis: - HCC screening with [place] every 6 months - EGD to r/o varices in cirrhotics    Electronically signed by:  Danelle Earthly, MD Infectious Diseases  Fax no. [phone]  I have personally spent 65 minutes involved in face-to-face and non-face-to-face activities for this patient on the day of the visit. Professional time spent includes the following activities: Preparing to see the patient (review of tests), Obtaining and/or reviewing separately obtained history (admission/discharge record), Performing a medically appropriate examination and/or evaluation , Ordering medications/tests/procedures, referring and communicating with other health care professionals, Documenting clinical information in the EMR, Independently interpreting results (not separately reported), Communicating  results to the patient/family/caregiver, Counseling and educating the patient/family/caregiver and Care coordination (not separately reported).

## 2023-07-08 NOTE — Congregational Nurse Program (Signed)
  Dept: [phone]   Congregational Nurse Program Note  Date of Encounter: 07/06/2023  Resident seen at clinic to confirm time for infectious disease appointment and was given bus passes to go to the appointment. Past Medical History: Past Medical History:  Diagnosis Date   Cataract    Glaucoma    Hearing deficit    Hepatitis C    Renal disorder     Encounter Details:  Community Questionnaire - 07/06/23 1100       Questionnaire   Ask client: Do you give verbal consent for me to treat you today? Yes    Student Assistance N/A    Location Patient Served  [facility]    Encounter Setting CN site    Population Status Unhoused    Insurance Medicaid;Medicare    Insurance/Financial Assistance Referral N/A    Medication N/A    Medical Provider Yes    Screening Referrals Made N/A    Medical Referrals Made N/A    Medical Appointment Completed N/A    CNP Interventions Advocate/Support;Case Management;Counsel    Screenings CN Performed N/A    ED Visit Averted N/A    Life-Saving Intervention Made N/A

## 2023-07-15 ENCOUNTER — Ambulatory Visit (HOSPITAL_COMMUNITY): Admission: RE | Admit: 2023-07-15 | Source: Ambulatory Visit

## 2023-07-15 LAB — CBC WITH DIFFERENTIAL/PLATELET
Absolute Lymphocytes: 1454 {cells}/uL (ref 850–3900)
Absolute Monocytes: 668 {cells}/uL (ref 200–950)
Basophils Absolute: 41 {cells}/uL (ref 0–200)
Basophils Relative: 0.8 %
Eosinophils Absolute: 148 {cells}/uL (ref 15–500)
Eosinophils Relative: 2.9 %
HCT: 36.1 % — ABNORMAL LOW (ref 38.5–50.0)
Hemoglobin: 11.9 g/dL — ABNORMAL LOW (ref 13.2–17.1)
MCH: 32.7 pg (ref 27.0–33.0)
MCHC: 33 g/dL (ref 32.0–36.0)
MCV: 99.2 fL (ref 80.0–100.0)
MPV: 9.5 fL (ref 7.5–12.5)
Monocytes Relative: 13.1 %
Neutro Abs: 2790 {cells}/uL (ref 1500–7800)
Neutrophils Relative %: 54.7 %
Platelets: 188 10*3/uL (ref 140–400)
RBC: 3.64 10*6/uL — ABNORMAL LOW (ref 4.20–5.80)
RDW: 13 % (ref 11.0–15.0)
Total Lymphocyte: 28.5 %
WBC: 5.1 10*3/uL (ref 3.8–10.8)

## 2023-07-15 LAB — COMPREHENSIVE METABOLIC PANEL WITH GFR
AG Ratio: 1.4 (calc) (ref 1.0–2.5)
ALT: 37 U/L (ref 9–46)
AST: 57 U/L — ABNORMAL HIGH (ref 10–35)
Albumin: 4.2 g/dL (ref 3.6–5.1)
Alkaline phosphatase (APISO): 103 U/L (ref 35–144)
BUN/Creatinine Ratio: 16 (calc) (ref 6–22)
BUN: 26 mg/dL — ABNORMAL HIGH (ref 7–25)
CO2: 27 mmol/L (ref 20–32)
Calcium: 9.4 mg/dL (ref 8.6–10.3)
Chloride: 106 mmol/L (ref 98–110)
Creat: 1.65 mg/dL — ABNORMAL HIGH (ref 0.70–1.35)
Globulin: 3.1 g/dL (ref 1.9–3.7)
Glucose, Bld: 81 mg/dL (ref 65–99)
Potassium: 4.3 mmol/L (ref 3.5–5.3)
Sodium: 142 mmol/L (ref 135–146)
Total Bilirubin: 0.4 mg/dL (ref 0.2–1.2)
Total Protein: 7.3 g/dL (ref 6.1–8.1)
eGFR: 46 mL/min/{1.73_m2} — ABNORMAL LOW (ref >=60)

## 2023-07-15 LAB — LIVER FIBROSIS, FIBROTEST-ACTITEST
ALT: 37 U/L (ref 9–46)
Alpha-2-Macroglobulin: 325 mg/dL — ABNORMAL HIGH (ref 106–279)
Apolipoprotein A1: 193 mg/dL — ABNORMAL HIGH (ref 94–176)
Bilirubin: 0.4 mg/dL (ref 0.2–1.2)
Fibrosis Score: 0.6
GGT: 153 U/L — ABNORMAL HIGH (ref 3–70)
Haptoglobin: 84 mg/dL (ref 43–212)
Necroinflammat ACT Score: 0.28
Reference ID: 5422293

## 2023-07-15 LAB — HCV RNA,LIPA RFLX NS5A DRUG RESIST

## 2023-07-15 LAB — HEPATITIS B CORE ANTIBODY, TOTAL: Hep B Core Total Ab: REACTIVE — AB

## 2023-07-15 LAB — PROTIME-INR
INR: 1.1
Prothrombin Time: 11.4 s (ref 9.0–11.5)

## 2023-07-15 LAB — HEPATITIS B SURFACE ANTIBODY,QUALITATIVE: Hep B S Ab: REACTIVE — AB

## 2023-07-15 LAB — HEPATITIS B SURFACE ANTIGEN: Hepatitis B Surface Ag: NONREACTIVE

## 2023-07-15 LAB — HEPATITIS A ANTIBODY, TOTAL: Hepatitis A AB,Total: NONREACTIVE

## 2023-07-15 LAB — RPR: RPR Ser Ql: NONREACTIVE

## 2023-07-15 LAB — HIV ANTIBODY (ROUTINE TESTING W REFLEX): HIV 1&2 Ab, 4th Generation: NONREACTIVE

## 2023-07-19 ENCOUNTER — Encounter: Payer: Self-pay | Admitting: *Deleted

## 2023-07-19 NOTE — Congregational Nurse Program (Signed)
  Dept: [phone]   Congregational Nurse Program Note  Date of Encounter: 07/19/2023  Past Medical History: Past Medical History:  Diagnosis Date   Cataract    Glaucoma    Hearing deficit    Hepatitis C    Renal disorder     Encounter Details:  Community Questionnaire - 07/19/23 1314       Questionnaire   Ask client: Do you give verbal consent for me to treat you today? N/A   client refused   Student Assistance N/A    Location Patient Served  [facility]    Encounter Setting CN site    Population Status Unhoused    Insurance Medicaid;Medicare    Insurance/Financial Assistance Referral N/A    Medication N/A    Medical Provider Yes    Screening Referrals Made N/A    Medical Referrals Made N/A    Medical Appointment Completed N/A    CNP Interventions Advocate/Support    Screenings CN Performed N/A    ED Visit Averted N/A    Life-Saving Intervention Made N/A            Writer alerted that client had fell outside [facility] shelter building. Went outside and client was sitting on concrete covered area outside of back door. Staff had called 911. Client keep asking for help to get up. Explained, writer needed to assess and take his blood pressure. When asked if he hit his head when he fell he said no and did not express or report any pain. He refused to have any further assessment and refused to have blood pressure taken. When asked if he had been drinking alcohol, He stated "everyday". He refused to have EMS come or assess him and staff canceled call. No blood seen on clothing or client. He is currently sitting back in his chair walker. Respirations are even and unlabored.  Henry Utsey W RN CN

## 2023-07-21 ENCOUNTER — Encounter: Payer: Self-pay | Admitting: *Deleted

## 2023-07-21 NOTE — Congregational Nurse Program (Signed)
  Dept: [phone]   Congregational Nurse Program Note  Date of Encounter: 07/21/2023  Past Medical History: Past Medical History:  Diagnosis Date   Cataract    Glaucoma    Hearing deficit    Hepatitis C    Renal disorder     Encounter Details:  Community Questionnaire - 07/21/23 1247       Questionnaire   Ask client: Do you give verbal consent for me to treat you today? Yes    Student Assistance N/A    Location Patient Served  [facility]    Encounter Setting CN site    Population Status Unhoused    Insurance Medicare;Medicaid    Insurance/Financial Assistance Referral N/A    Medication N/A    Medical Provider Yes    Screening Referrals Made N/A    Medical Referrals Made [facility]    Medical Appointment Completed N/A    CNP Interventions Advocate/Support    Screenings CN Performed Blood Pressure    ED Visit Averted N/A    Life-Saving Intervention Made N/A            Client requested to see MD in [facility] clinic. Completed triage form. Blood pressure 136/74, pulse 89, SpO2 96%.  Avalin Briley W RN CN

## 2023-07-22 ENCOUNTER — Ambulatory Visit (INDEPENDENT_AMBULATORY_CARE_PROVIDER_SITE_OTHER): Payer: 59 | Admitting: Primary Care

## 2023-07-28 ENCOUNTER — Encounter: Payer: Self-pay | Admitting: *Deleted

## 2023-07-28 ENCOUNTER — Encounter: Payer: Self-pay | Admitting: Family Medicine

## 2023-07-28 ENCOUNTER — Other Ambulatory Visit: Payer: Self-pay | Admitting: Family Medicine

## 2023-07-28 MED ORDER — TERBINAFINE HCL 1 % EX CREA
1.0000 | TOPICAL_CREAM | Freq: Two times a day (BID) | CUTANEOUS | 0 refills | Status: AC
  Filled 2023-07-28: qty 30, 15d supply, fill #0

## 2023-07-28 NOTE — Congregational Nurse Program (Signed)
  Dept: [phone]   Congregational Nurse Program Note  Date of Encounter: 07/28/2023  Past Medical History: Past Medical History:  Diagnosis Date   Cataract    Glaucoma    Hearing deficit    Hepatitis C    Renal disorder     Encounter Details:  Community Questionnaire - 07/28/23 1334       Questionnaire   Ask client: Do you give verbal consent for me to treat you today? Yes    Student Assistance N/A    Location Patient Served  [facility]    Encounter Setting CN site    Population Status Unhoused    Insurance Medicare;Medicaid    Insurance/Financial Assistance Referral N/A    Medication N/A    Medical Provider Yes    Screening Referrals Made N/A    Medical Referrals Made [facility]    Medical Appointment Completed N/A    CNP Interventions Advocate/Support    Screenings CN Performed Blood Pressure    ED Visit Averted N/A    Life-Saving Intervention Made N/A           Client signed sheet to see MD in [facility] clinic. He c/o rt hip discomfort and athletes foot. Completed triage form for client to see MD in [facility] clinic. Blood pressure 133/77, pulse (!) 105, SpO2 98%.  Yuko Coventry W RN CN

## 2023-07-28 NOTE — Progress Notes (Signed)
 [employer]  S: 68 yo hx hep C, cirrhosis, CKD and multiple other medical issues who has complaints of R lower extremity pain.  He notes it started after Daisean Brodhead fall after he was attacked by dogs several years ago.   Notes R hip pain since then.  He's c/o pain in the bone today.  Notably he fells T[place]en Pantoja few times recently, but isn't able to describe this much.  The pain makes it difficult for him to walk.    O: BP 133/77 HR 105 98% on RA General: No acute distress. Lungs: unlabored Neurological: Alert and oriented 3. Moves all extremities 4. Cranial nerves II through XII grossly intact. Extremities: TTP to R hip.  TTP of thigh.  No visible deformities.  Palpable DP pulses.    Arlette Schaad/P: 68 yo with hx hep C, cirrhosis, CKD, etc here with R hip pain.  R hip pain is chronic possibly worsening recently.  He's not interested in tylenol  or topical NSAIDs.  He has follow up appointment on 5/15 with Dr. Brent Cambric, he'll need plain films then - given relative chronicity, I think ok to wait until then.  Terbinafine  for athletes foot.   Donnetta Gains, MD

## 2023-07-29 ENCOUNTER — Other Ambulatory Visit: Payer: Self-pay

## 2023-07-29 ENCOUNTER — Encounter: Payer: Self-pay | Admitting: *Deleted

## 2023-07-29 NOTE — Congregational Nurse Program (Signed)
  Dept: [phone]   Congregational Nurse Program Note  Date of Encounter: 07/29/2023  Past Medical History: Past Medical History:  Diagnosis Date   Cataract    Glaucoma    Hearing deficit    Hepatitis C    Renal disorder     Encounter Details:  Community Questionnaire - 07/29/23 1512       Questionnaire   Ask client: Do you give verbal consent for me to treat you today? N/A    Student Assistance N/A    Location Patient Served  [facility]    Encounter Setting CN site    Population Status Unhoused    Insurance Medicaid;Medicare    Insurance/Financial Assistance Referral N/A    Medication Have Medication Insecurities;Provided Medication Assistance    Medical Provider Yes    Screening Referrals Made N/A    Medical Referrals Made N/A    Medical Appointment Completed N/A    CNP Interventions Advocate/Support;Case Management    Screenings CN Performed N/A    ED Visit Averted N/A    Life-Saving Intervention Made N/A            Picked up medication terbinafine  cream from [facility] pharmacy and brought to [facility]. Medication left with [facility] front desk staff for client with client's name and bed number.. Laruth Hanger W RN CN

## 2023-08-03 NOTE — Congregational Nurse Program (Signed)
  Dept: [phone]   Congregational Nurse Program Note  Date of Encounter: 08/03/2023  Resident states he was injured by a fall from a dog bite about a year ago and now has right hip pain that is constant.  Denies numbness and tingling, no swelling noted.  BP 133/81, pulse 82 and regular.  Appointment made with PCP to determine if orthopedic referral is needed. Past Medical History: Past Medical History:  Diagnosis Date   Cataract    Glaucoma    Hearing deficit    Hepatitis C    Renal disorder     Encounter Details:  Community Questionnaire - 08/03/23 1237       Questionnaire   Ask client: Do you give verbal consent for me to treat you today? N/A    Student Assistance N/A    Location Patient Served  [facility]    Encounter Setting CN site    Population Status Unhoused    Insurance Medicaid;Medicare    Insurance/Financial Assistance Referral N/A    Medication Have Medication Insecurities    Medical Provider Yes    Screening Referrals Made N/A    Medical Referrals Made [facility]    Medical Appointment Completed N/A    CNP Interventions Advocate/Support;Case Management;Navigate Healthcare System;Counsel    Screenings CN Performed N/A    ED Visit Averted N/A    Life-Saving Intervention Made N/A

## 2023-08-09 ENCOUNTER — Ambulatory Visit (HOSPITAL_COMMUNITY)

## 2023-08-10 ENCOUNTER — Other Ambulatory Visit: Payer: Self-pay

## 2023-08-11 ENCOUNTER — Ambulatory Visit: Admitting: Nurse Practitioner

## 2023-08-12 ENCOUNTER — Encounter: Payer: Self-pay | Admitting: *Deleted

## 2023-08-12 ENCOUNTER — Encounter: Payer: Self-pay | Admitting: Internal Medicine

## 2023-08-12 NOTE — Progress Notes (Signed)
 68 year old PMH significant for CKD, Cirrhosis , Hx hepatitis C. Chronic Right hip /leg pain since after he was bitten by dogs a year ago presents complaining of inability to ambulate, right hip pain, feels bone to bone. He has been taking baby aspirin, advised to only take aspirin once a day due to his renal insufficiency. I provided him with Voltaren Gel to apply BID. He continue to drink alcohol 2 beer a day. We talk about weaning himself of alcohol. He will only drink once a day then every other day until stop. Provided multivitamin. We consider using tylenol  for pain but he continue to drink alcohol and has cirrhosis. He has an appointment with Dr Brent Cambric 5/15, he will need referral for x ray.

## 2023-08-12 NOTE — Congregational Nurse Program (Signed)
  Dept: [phone]   Congregational Nurse Program Note  Date of Encounter: 08/12/2023  Past Medical History: Past Medical History:  Diagnosis Date   Cataract    Glaucoma    Hearing deficit    Hepatitis C    Renal disorder     Encounter Details:  Community Questionnaire - 08/12/23 1137       Questionnaire   Ask client: Do you give verbal consent for me to treat you today? Yes    Student Assistance N/A    Location Patient Served  [facility]    Encounter Setting CN site    Population Status Unhoused    Insurance Medicaid;Medicare    Insurance/Financial Assistance Referral N/A    Medication N/A    Medical Provider Yes    Screening Referrals Made N/A    Medical Referrals Made N/A    Medical Appointment Completed N/A    CNP Interventions Advocate/Support;Navigate Healthcare System    Screenings CN Performed Blood Pressure    ED Visit Averted N/A    Life-Saving Intervention Made N/A           Writer assisted MD in [facility] clinic. Blood pressure 119/78, pulse 92, SpO2 98%. Wrote information and gave to client about upcoming appointments. Glyn Zendejas W RN CN

## 2023-08-19 ENCOUNTER — Ambulatory Visit: Admitting: Critical Care Medicine

## 2023-08-19 ENCOUNTER — Other Ambulatory Visit: Payer: Self-pay | Admitting: Emergency Medicine

## 2023-08-19 NOTE — Progress Notes (Signed)
 Patient reports pain in right hip bone for years due to fall after pit bull attack He is ambulatory but pain is worse Reports he has MRI planned for today No other complaints He can range right HIP without difficulty and no obvious deformities  Review of notes reveal he was supposed to get xray of right hip today at 950am which he missed. Will attempt to re-schedule xray appt  Pt requesting ASA 81mg  for his pain which has worked previously

## 2023-09-01 ENCOUNTER — Other Ambulatory Visit: Payer: Self-pay | Admitting: Critical Care Medicine

## 2023-09-01 ENCOUNTER — Encounter: Payer: Self-pay | Admitting: Physician Assistant

## 2023-09-01 ENCOUNTER — Encounter: Payer: Self-pay | Admitting: *Deleted

## 2023-09-01 DIAGNOSIS — G8929 Other chronic pain: Secondary | ICD-10-CM

## 2023-09-01 DIAGNOSIS — H269 Unspecified cataract: Secondary | ICD-10-CM

## 2023-09-01 DIAGNOSIS — H409 Unspecified glaucoma: Secondary | ICD-10-CM

## 2023-09-01 NOTE — Congregational Nurse Program (Signed)
  Dept: [phone]   Congregational Nurse Program Note  Date of Encounter: 09/01/2023  Past Medical History: Past Medical History:  Diagnosis Date   Cataract    Glaucoma    Hearing deficit    Hepatitis C    Renal disorder     Encounter Details:  Community Questionnaire - 09/01/23 1425       Questionnaire   Ask client: Do you give verbal consent for me to treat you today? Yes    Student Assistance N/A    Location Patient Served  [facility]    Encounter Setting CN site    Population Status Unhoused    Insurance Medicaid;Medicare    Insurance/Financial Assistance Referral N/A    Medication N/A    Medical Provider Yes    Screening Referrals Made N/A    Medical Referrals Made [facility]    Medical Appointment Completed N/A    CNP Interventions Advocate/Support;Navigate Healthcare System    Screenings CN Performed Blood Pressure    ED Visit Averted N/A    Life-Saving Intervention Made N/A            Client requested to see MD at [facility] clinic. Completed triage form to see Dr Brent Cambric today at [facility]. Blood pressure 136/79, pulse 98, SpO2 99%.  Veatrice Eckstein W RN CN

## 2023-09-01 NOTE — Progress Notes (Signed)
 Eye ref

## 2023-09-01 NOTE — Progress Notes (Signed)
 Pt c/o difficulty walking every since he fell. Says that his balance is bad, not having pain..  Had a fall in early March, w/ R knee injury and R calf wound. The knee and shin wound have healed well.   He c/o R hip bothering him, says no pain, but it keeps him from walking well. Has to use a cane, has a rollator but does not use.   No visible deformity of the hip. He has weakness on R leg, in all movements. Says does not need pain pills.    An Xray to his hip/pelvis was ordered.   Says is drinking 3 beers/day. Was requested to cut down to 1 beer/day.   Dr Brent Cambric is arranging f/u in his office.  Pt also needs to see Eye MD >> referred to Dr Candi Chafe as his glaucoma has not been recently treated.    Armandina Bernard, PA-C 09/01/2023 3:53 PM

## 2023-09-02 ENCOUNTER — Encounter: Payer: Self-pay | Admitting: *Deleted

## 2023-09-02 ENCOUNTER — Ambulatory Visit (HOSPITAL_COMMUNITY)
Admission: RE | Admit: 2023-09-02 | Discharge: 2023-09-02 | Disposition: A | Discharge status: Home / Self Care | Source: Ambulatory Visit | Attending: Critical Care Medicine | Admitting: Critical Care Medicine

## 2023-09-02 DIAGNOSIS — G8929 Other chronic pain: Secondary | ICD-10-CM | POA: Insufficient documentation

## 2023-09-02 DIAGNOSIS — M25551 Pain in right hip: Secondary | ICD-10-CM | POA: Insufficient documentation

## 2023-09-02 NOTE — Congregational Nurse Program (Signed)
  Dept: [phone]   Congregational Nurse Program Note  Date of Encounter: 09/02/2023  Past Medical History: Past Medical History:  Diagnosis Date   Cataract    Glaucoma    Hearing deficit    Hepatitis C    Renal disorder     Encounter Details:  Community Questionnaire - 09/02/23 1123       Questionnaire   Ask client: Do you give verbal consent for me to treat you today? Yes    Student Assistance N/A    Location Patient Served  [facility]    Encounter Setting CN site    Population Status Unhoused    Insurance Medicare;Medicaid    Insurance/Financial Assistance Referral N/A    Medication N/A    Medical Provider Yes    Screening Referrals Made N/A    Medical Referrals Made [facility]    Medical Appointment Completed N/A    CNP Interventions Advocate/Support;Case Management    Screenings CN Performed N/A    ED Visit Averted N/A    Life-Saving Intervention Made N/A           Client seen outside [facility] after returning from an x-ray appt. Client was given verbal and written information reminder of upcoming appt June 5th 10:50 with Dr Brent Cambric at [facility].  Azavier Creson W RN CN

## 2023-09-06 ENCOUNTER — Encounter: Payer: Self-pay | Admitting: *Deleted

## 2023-09-06 NOTE — Congregational Nurse Program (Signed)
  Dept: [phone]   Congregational Nurse Program Note  Date of Encounter: 09/06/2023  Past Medical History: Past Medical History:  Diagnosis Date   Cataract    Glaucoma    Hearing deficit    Hepatitis C    Renal disorder     Encounter Details:  Community Questionnaire - 09/06/23 1051       Questionnaire   Ask client: Do you give verbal consent for me to treat you today? Yes    Student Assistance N/A    Location Patient Served  [facility]    Encounter Setting CN site    Population Status Unhoused    Insurance Medicaid;Medicare    Insurance/Financial Assistance Referral N/A    Medication N/A    Medical Provider Yes    Screening Referrals Made N/A    Medical Referrals Made [facility]    Medical Appointment Completed N/A    CNP Interventions Advocate/Support;Navigate Healthcare System;Case Management    Screenings CN Performed N/A    ED Visit Averted N/A    Life-Saving Intervention Made N/A            Contacted Dr Marvin Slot office for vision appt requested by [facility] MD. Client has an appt June 12th at 10:30. Written and verbal information given to client along with reminder for June 5th PCP appt.  Jeffrey [name] W RN CN
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
## 2023-09-09 ENCOUNTER — Ambulatory Visit: Attending: Critical Care Medicine | Admitting: Critical Care Medicine

## 2023-09-09 ENCOUNTER — Telehealth: Payer: Self-pay

## 2023-09-09 ENCOUNTER — Other Ambulatory Visit: Payer: Self-pay

## 2023-09-09 ENCOUNTER — Encounter: Payer: Self-pay | Admitting: Critical Care Medicine

## 2023-09-09 ENCOUNTER — Other Ambulatory Visit (HOSPITAL_COMMUNITY): Payer: Self-pay

## 2023-09-09 VITALS — BP 132/74 | HR 69 | Temp 98.8°F | Ht 68.0 in | Wt 113.0 lb

## 2023-09-09 DIAGNOSIS — B182 Chronic viral hepatitis C: Secondary | ICD-10-CM

## 2023-09-09 DIAGNOSIS — K029 Dental caries, unspecified: Secondary | ICD-10-CM | POA: Insufficient documentation

## 2023-09-09 DIAGNOSIS — M25551 Pain in right hip: Secondary | ICD-10-CM | POA: Diagnosis not present

## 2023-09-09 DIAGNOSIS — K089 Disorder of teeth and supporting structures, unspecified: Secondary | ICD-10-CM

## 2023-09-09 DIAGNOSIS — N1831 Chronic kidney disease, stage 3a: Secondary | ICD-10-CM

## 2023-09-09 MED ORDER — PREDNISONE 10 MG PO TABS
40.0000 mg | ORAL_TABLET | Freq: Every day | ORAL | 0 refills | Status: DC
  Filled 2023-09-09: qty 20, 5d supply, fill #0

## 2023-09-09 MED ORDER — MAVYRET 100-40 MG PO TABS
3.0000 | ORAL_TABLET | Freq: Every day | ORAL | 1 refills | Status: AC
  Filled 2023-09-09: qty 84, 28d supply, fill #0
  Filled 2023-09-28 – 2023-10-01 (×2): qty 84, 28d supply, fill #1

## 2023-09-09 NOTE — Patient Instructions (Signed)
 Your blood pressure is normal  Start prednisone  4 daily for 5 days to help with hip pain  We want you to go to the hepatitis C clinic you were there in April it is downstairs on the first floor the open at 1230 please go in there and the pharmacist will give you medications for your hepatitis C  I have made referrals to orthopedic surgery and a dentist will take the date and time and please keep your appointment with the eye doctor next week on the 12th Dr. Marvin Slot office  Return to primary care 4 months

## 2023-09-09 NOTE — Progress Notes (Signed)
 Specialty Pharmacy Initial Fill Coordination Note  Jeffrey [name] is a 68 y.o. male contacted today regarding initial fill of specialty medication(s) Glecaprevir-Pibrentasvir Herbalist)   Patient requested Courier to Provider Office   Delivery date: 09/13/23   Verified address: [facility] [address] [place] [address]   Medication will be filled on 09/10/23.   Patient is aware of $0 copayment.

## 2023-09-09 NOTE — Assessment & Plan Note (Signed)
 Stable

## 2023-09-09 NOTE — Addendum Note (Signed)
 Addended by: Sonya Duster on: 09/09/2023 02:12 PM   Modules accepted: Orders

## 2023-09-09 NOTE — Telephone Encounter (Signed)
 Pharmacy Patient Advocate Encounter   Received notification from CoverMyMeds that prior authorization for MAVYRET is required/requested.   Insurance verification completed.   The patient is insured through [facility] .   Per test claim: PA required; PA submitted to above mentioned insurance via CoverMyMeds Key/confirmation #/EOC  [id number] Status is pending   CLINICAL QUESTIONS ANSWERED

## 2023-09-09 NOTE — Telephone Encounter (Signed)
 Pharmacy Patient Advocate Encounter  Received notification from [employer] that Prior Authorization for MAVYRET has been APPROVED from 09/09/23 to 11/04/2023   PA #/Case ID/Reference #: [id number]

## 2023-09-09 NOTE — Assessment & Plan Note (Signed)
 X-ray of hip normal will give pulse prednisone  and refer to orthopedics likely bursitis

## 2023-09-09 NOTE — Assessment & Plan Note (Signed)
Referral to dentistry 

## 2023-09-09 NOTE — Assessment & Plan Note (Signed)
 Called hepatitis C clinic to get patient on medication

## 2023-09-10 ENCOUNTER — Other Ambulatory Visit: Payer: Self-pay

## 2023-09-13 ENCOUNTER — Telehealth: Payer: Self-pay

## 2023-09-13 ENCOUNTER — Telehealth: Payer: Self-pay | Admitting: Pharmacist

## 2023-09-13 NOTE — Telephone Encounter (Signed)
 Received a phone call from Dr. Arlene Lacy on Thursday 6/5, regarding patient's Hepatitis C therapy. He was seeing patient in office and asked if he could come down after his appointment and pick up his Hep C medication prescription. I discussed case with Dr. Brent Cambric and asked him to tell patient to stop by our office that afternoon after his appointment. Patient never showed up that afternoon.  We spoke with patient on the phone who was confusing his Hep C prescription with his prednisone  that he picked up from [facility]. After some back and forth, we advised patient to please come to our office and pick up on Friday 6/6. Unfortunately, patient did not show up again. We will keep trying.  Dion Parrow L. Chasidy Janak, PharmD, BCIDP, AAHIVP, CPP Infectious Diseases Clinical Pharmacist Practitioner Clinical Pharmacist Lead, Specialty Pharmacy [facility] for Infectious Disease 09/13/2023, 11:17 AM

## 2023-09-13 NOTE — Telephone Encounter (Signed)
 [facility] Patient Advocate Encounter  Patient's medications Mavyret  have been couriered to [facility] from [facility] and will picked up.  Roylene Corn, CPhT Specialty Pharmacy Patient [facility] for Infectious Disease Phone: [phone] Fax:  [phone]

## 2023-09-15 ENCOUNTER — Other Ambulatory Visit: Payer: Self-pay | Admitting: Pharmacist

## 2023-09-15 ENCOUNTER — Other Ambulatory Visit (HOSPITAL_COMMUNITY): Payer: Self-pay

## 2023-09-15 NOTE — Progress Notes (Signed)
 Specialty Pharmacy Initiation Note   Jeffrey [name] is a 68 y.o. male who will be followed by the specialty pharmacy service for RxSp Hepatitis C    Review of administration, indication, effectiveness, safety, potential side effects, storage/disposable, and missed dose instructions occurred today for patient's specialty medication(s) Glecaprevir -Pibrentasvir  (Mavyret )     Patient/Caregiver did not have any additional questions or concerns.   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 Patient will be evaluated at upcoming provider appointment to assess progress      Comply with lab assessments       Patient is initiating therapy. Patient will adhere to provider and/or lab appointments         Sonya Duster Specialty Pharmacist

## 2023-09-16 DIAGNOSIS — H401111 Primary open-angle glaucoma, right eye, mild stage: Secondary | ICD-10-CM | POA: Diagnosis not present

## 2023-09-28 ENCOUNTER — Other Ambulatory Visit: Payer: Self-pay

## 2023-10-01 ENCOUNTER — Other Ambulatory Visit: Payer: Self-pay

## 2023-10-01 NOTE — Progress Notes (Signed)
 Specialty Pharmacy Refill Coordination Note  Jeffrey [name] is a 68 y.o. male contacted today regarding refills of specialty medication(s) Glecaprevir -Pibrentasvir  (Mavyret )   Patient requested Delivery   Delivery date: 10/05/23   Verified address: [address], [place] [address]   Medication will be filled on 10/04/23.

## 2023-10-08 NOTE — Progress Notes (Deleted)
 HPI: Jeffrey [name] is a 68 y.o. male who presents to the [facility] pharmacy clinic for Hepatitis C follow-up.  Medication: Mavyret  x 8 weeks  Start Date: 09/16/23  Hepatitis C Genotype: 1b  Fibrosis Score: F3  Hepatitis C RNA: 3.66 million (06/2023)  Patient Active Problem List   Diagnosis Date Noted   Right hip pain 09/09/2023   Hepatoma (HCC) 06/17/2023   Alcoholic cirrhosis, unspecified whether ascites present (HCC) 06/17/2023   Glaucoma of both eyes 06/17/2023   Frequent falls 06/17/2023   Cataract of both eyes 06/17/2023   Dental disease 06/17/2023   Sheltered homelessness 06/17/2023   Hepatitis C 01/27/2018   CKD (chronic kidney disease) stage 3, GFR 30-59 ml/min (HCC) 01/27/2018   Decreased hearing of left ear 01/27/2018   Bilateral chronic knee pain 01/27/2018   Alcohol dependence (HCC) 01/27/2018   Tobacco use 01/27/2018    Patient's Medications  New Prescriptions   No medications on file  Previous Medications   GLECAPREVIR -PIBRENTASVIR  (MAVYRET ) 100-40 MG TABS    Take 3 tablets by mouth daily with breakfast.   PREDNISONE  (DELTASONE ) 10 MG TABLET    Take 4 tablets (40 mg total) by mouth daily for 5 days then stop.  Modified Medications   No medications on file  Discontinued Medications   No medications on file    Allergies: No Known Allergies  Past Medical History: Past Medical History:  Diagnosis Date   Cataract    Glaucoma    Hearing deficit    Hepatitis C    Renal disorder     Social History: Social History   Socioeconomic History   Marital status: Divorced    Spouse name: Not on file   Number of children: Not on file   Years of education: Not on file   Highest education level: Not on file  Occupational History   Not on file  Tobacco Use   Smoking status: Every Day    Current packs/day: 0.25    Types: Cigarettes   Smokeless tobacco: Never  Substance and Sexual Activity   Alcohol use: Yes    Comment: daily    Drug use: No   Sexual  activity: Not on file  Other Topics Concern   Not on file  Social History Narrative   Not on file   Social Drivers of Health   Financial Resource Strain: Not on file  Food Insecurity: Food Insecurity Present (06/17/2023)   Hunger Vital Sign    Worried About Running Out of Food in the Last Year: Often true    Ran Out of Food in the Last Year: Sometimes true  Transportation Needs: Unmet Transportation Needs (06/17/2023)   PRAPARE - Administrator, Civil Service (Medical): Yes    Lack of Transportation (Non-Medical): Yes  Physical Activity: Not on file  Stress: Not on file  Social Connections: Not on file    Labs: Hepatitis C Lab Results  Component Value Date   HCVGENOTYPE 1b 07/07/2023   FIBROSTAGE F3 07/07/2023   Hepatitis B Lab Results  Component Value Date   HEPBSAB REACTIVE (A) 07/07/2023   HEPBSAG NON-REACTIVE 07/07/2023   HEPBCAB REACTIVE (A) 07/07/2023   Hepatitis A Lab Results  Component Value Date   HAV NON-REACTIVE 07/07/2023   HIV Lab Results  Component Value Date   HIV NON-REACTIVE 07/07/2023   Lab Results  Component Value Date   CREATININE 1.65 (H) 07/07/2023   CREATININE 1.65 (H) 06/17/2023   CREATININE 1.48 (H) 05/20/2023  CREATININE 1.71 (H) 01/27/2018   CREATININE 1.46 (H) 05/25/2017   Lab Results  Component Value Date   AST 57 (H) 07/07/2023   AST 41 (H) 06/17/2023   AST 61 (H) 05/20/2023   ALT 37 07/07/2023   ALT 37 07/07/2023   ALT 33 06/17/2023   INR 1.1 07/07/2023   INR 0.96 05/31/2010    Assessment: Jeffrey [name] presents to clinic today for HCV follow-up. He has been taking Mavyret  for 4 weeks and has not missed any doses. Has been tolerating the medication well. Will check HCV RNA along with CMP today and follow-up in 4 weeks with Dr. Dennise.  Eligible for HAV, Shingles, PCV20, and Tdap vaccines which he declines today.    Plan: - Check HCV RNA and CMP  - Continue Mavyret  x 4 weeks - Follow up with Dr. Dennise on  ***  Alan Geralds, PharmD, CPP, BCIDP, AAHIVP Clinical Pharmacist Practitioner Infectious Diseases Clinical Pharmacist [facility] 10/08/2023, 3:49 PM

## 2023-10-14 ENCOUNTER — Ambulatory Visit: Admitting: Pharmacist

## 2023-10-14 DIAGNOSIS — B182 Chronic viral hepatitis C: Secondary | ICD-10-CM

## 2023-10-19 ENCOUNTER — Other Ambulatory Visit: Payer: Self-pay | Admitting: Pharmacy Technician

## 2023-10-19 ENCOUNTER — Other Ambulatory Visit: Payer: Self-pay

## 2023-10-27 ENCOUNTER — Other Ambulatory Visit: Payer: Self-pay

## 2023-10-27 ENCOUNTER — Emergency Department (HOSPITAL_COMMUNITY)

## 2023-10-27 ENCOUNTER — Emergency Department (HOSPITAL_COMMUNITY)
Admission: EM | Admit: 2023-10-27 | Discharge: 2023-10-28 | Disposition: A | Discharge status: Home / Self Care | Attending: Emergency Medicine | Admitting: Emergency Medicine

## 2023-10-27 DIAGNOSIS — S0003XA Contusion of scalp, initial encounter: Secondary | ICD-10-CM | POA: Diagnosis not present

## 2023-10-27 DIAGNOSIS — Y92481 Parking lot as the place of occurrence of the external cause: Secondary | ICD-10-CM | POA: Insufficient documentation

## 2023-10-27 DIAGNOSIS — F10120 Alcohol abuse with intoxication, uncomplicated: Secondary | ICD-10-CM | POA: Diagnosis not present

## 2023-10-27 DIAGNOSIS — F10920 Alcohol use, unspecified with intoxication, uncomplicated: Secondary | ICD-10-CM

## 2023-10-27 DIAGNOSIS — Z043 Encounter for examination and observation following other accident: Secondary | ICD-10-CM | POA: Diagnosis not present

## 2023-10-27 DIAGNOSIS — W19XXXA Unspecified fall, initial encounter: Secondary | ICD-10-CM | POA: Diagnosis not present

## 2023-10-27 DIAGNOSIS — R519 Headache, unspecified: Secondary | ICD-10-CM | POA: Diagnosis present

## 2023-10-27 DIAGNOSIS — S199XXA Unspecified injury of neck, initial encounter: Secondary | ICD-10-CM | POA: Diagnosis not present

## 2023-10-27 DIAGNOSIS — R9431 Abnormal electrocardiogram [ECG] [EKG]: Secondary | ICD-10-CM | POA: Diagnosis not present

## 2023-10-27 DIAGNOSIS — Y907 Blood alcohol level of 200-239 mg/100 ml: Secondary | ICD-10-CM | POA: Diagnosis not present

## 2023-10-27 DIAGNOSIS — S0990XA Unspecified injury of head, initial encounter: Secondary | ICD-10-CM | POA: Diagnosis not present

## 2023-10-27 DIAGNOSIS — Z79899 Other long term (current) drug therapy: Secondary | ICD-10-CM | POA: Diagnosis not present

## 2023-10-27 DIAGNOSIS — I700 Atherosclerosis of aorta: Secondary | ICD-10-CM | POA: Diagnosis not present

## 2023-10-27 DIAGNOSIS — M160 Bilateral primary osteoarthritis of hip: Secondary | ICD-10-CM | POA: Diagnosis not present

## 2023-10-27 DIAGNOSIS — G319 Degenerative disease of nervous system, unspecified: Secondary | ICD-10-CM | POA: Diagnosis not present

## 2023-10-27 DIAGNOSIS — G9389 Other specified disorders of brain: Secondary | ICD-10-CM | POA: Diagnosis not present

## 2023-10-27 LAB — CBC WITH DIFFERENTIAL/PLATELET
Abs Immature Granulocytes: 0.02 K/uL (ref 0.00–0.07)
Basophils Absolute: 0 K/uL (ref 0.0–0.1)
Basophils Relative: 1 %
Eosinophils Absolute: 0.2 K/uL (ref 0.0–0.5)
Eosinophils Relative: 4 %
HCT: 35.1 % — ABNORMAL LOW (ref 39.0–52.0)
Hemoglobin: 11.9 g/dL — ABNORMAL LOW (ref 13.0–17.0)
Immature Granulocytes: 1 %
Lymphocytes Relative: 43 %
Lymphs Abs: 1.9 K/uL (ref 0.7–4.0)
MCH: 33.3 pg (ref 26.0–34.0)
MCHC: 33.9 g/dL (ref 30.0–36.0)
MCV: 98.3 fL (ref 80.0–100.0)
Monocytes Absolute: 0.4 K/uL (ref 0.1–1.0)
Monocytes Relative: 11 %
Neutro Abs: 1.7 K/uL (ref 1.7–7.7)
Neutrophils Relative %: 40 %
Platelets: 124 K/uL — ABNORMAL LOW (ref 150–400)
RBC: 3.57 MIL/uL — ABNORMAL LOW (ref 4.22–5.81)
RDW: 14.5 % (ref 11.5–15.5)
WBC: 4.2 K/uL (ref 4.0–10.5)
nRBC: 0 % (ref 0.0–0.2)

## 2023-10-27 LAB — COMPREHENSIVE METABOLIC PANEL WITH GFR
ALT: 67 U/L — ABNORMAL HIGH (ref 0–44)
AST: 84 U/L — ABNORMAL HIGH (ref 15–41)
Albumin: 3.6 g/dL (ref 3.5–5.0)
Alkaline Phosphatase: 88 U/L (ref 38–126)
Anion gap: 11 (ref 5–15)
BUN: 22 mg/dL (ref 8–23)
CO2: 18 mmol/L — ABNORMAL LOW (ref 22–32)
Calcium: 8.9 mg/dL (ref 8.9–10.3)
Chloride: 107 mmol/L (ref 98–111)
Creatinine, Ser: 1.77 mg/dL — ABNORMAL HIGH (ref 0.61–1.24)
GFR, Estimated: 42 mL/min — ABNORMAL LOW (ref >60)
Glucose, Bld: 80 mg/dL (ref 70–99)
Potassium: 3.6 mmol/L (ref 3.5–5.1)
Sodium: 136 mmol/L (ref 135–145)
Total Bilirubin: 0.7 mg/dL (ref 0.0–1.2)
Total Protein: 7 g/dL (ref 6.5–8.1)

## 2023-10-27 LAB — ETHANOL: Alcohol, Ethyl (B): 225 mg/dL — ABNORMAL HIGH (ref <15)

## 2023-10-27 LAB — CK: Total CK: 268 U/L (ref 49–397)

## 2023-10-27 NOTE — ED Provider Notes (Signed)
 [place] EMERGENCY DEPARTMENT AT [facility] Provider Note   CSN: [id number] Arrival date & time: 10/27/23  2225     Patient presents with: Jeffrey [name] is a 68 y.o. male.  {Add pertinent medical, surgical, social history, OB history to YEP:67052} Patient presents to the emergency department for evaluation after a fall.  Patient was found on the ground outside a bar.  He was on the ground for an unknown amount of time.  Apparently he uses a walker assist device and this may have malfunctioned causing his fall.  Patient complaining of a lump on the back of his head with headache.  He admits to 2 beers tonight.       Prior to Admission medications   Medication Sig Start Date End Date Taking? Authorizing Provider  Glecaprevir -Pibrentasvir  (MAVYRET ) 100-40 MG TABS Take 3 tablets by mouth daily with breakfast. 09/09/23   Waddell Alan PARAS, RPH-CPP  predniSONE  (DELTASONE ) 10 MG tablet Take 4 tablets (40 mg total) by mouth daily for 5 days then stop. 09/09/23   Brien Belvie FORBES, MD    Allergies: Patient has no known allergies.    Review of Systems  Updated Vital Signs BP 124/82 (BP Location: Right Arm)   Pulse 67   Temp (!) 97.3 F (36.3 C) (Oral)   Resp 10   Ht 5' 6 (1.676 m)   Wt 59 kg   SpO2 100%   BMI 20.98 kg/m   Physical Exam Vitals and nursing note reviewed.  Constitutional:      General: He is not in acute distress.    Appearance: He is well-developed.  HENT:     Head: Normocephalic. Contusion present.      Mouth/Throat:     Mouth: Mucous membranes are moist.  Eyes:     General: Vision grossly intact. Gaze aligned appropriately.     Extraocular Movements: Extraocular movements intact.     Conjunctiva/sclera: Conjunctivae normal.  Cardiovascular:     Rate and Rhythm: Normal rate and regular rhythm.     Pulses: Normal pulses.     Heart sounds: Normal heart sounds, S1 normal and S2 normal. No murmur heard.    No friction rub. No gallop.   Pulmonary:     Effort: Pulmonary effort is normal. No respiratory distress.     Breath sounds: Normal breath sounds.  Abdominal:     Palpations: Abdomen is soft.     Tenderness: There is no abdominal tenderness. There is no guarding or rebound.     Hernia: No hernia is present.  Musculoskeletal:        General: No swelling.     Cervical back: Full passive range of motion without pain, normal range of motion and neck supple. No pain with movement, spinous process tenderness or muscular tenderness. Normal range of motion.     Right lower leg: No edema.     Left lower leg: No edema.  Skin:    General: Skin is warm and dry.     Capillary Refill: Capillary refill takes less than 2 seconds.     Findings: No ecchymosis, erythema, lesion or wound.  Neurological:     Mental Status: He is alert and oriented to person, place, and time.     GCS: GCS eye subscore is 4. GCS verbal subscore is 5. GCS motor subscore is 6.     Cranial Nerves: Cranial nerves 2-12 are intact.     Sensory: Sensation is intact.  Motor: Motor function is intact. No weakness or abnormal muscle tone.     Coordination: Coordination is intact.  Psychiatric:        Mood and Affect: Mood normal.        Speech: Speech normal.        Behavior: Behavior normal.     (all labs ordered are listed, but only abnormal results are displayed) Labs Reviewed  CBC WITH DIFFERENTIAL/PLATELET - Abnormal; Notable for the following components:      Result Value   RBC 3.57 (*)    Hemoglobin 11.9 (*)    HCT 35.1 (*)    Platelets 124 (*)    All other components within normal limits  COMPREHENSIVE METABOLIC PANEL WITH GFR  ETHANOL  CK    EKG: None  Radiology: DG Pelvis Portable Result Date: 10/27/2023 CLINICAL DATA:  Fall EXAM: PORTABLE PELVIS 1-2 VIEWS COMPARISON:  None Available. FINDINGS: SI joints are non widened. Pubic symphysis and rami appear intact. No fracture or dislocation. Mild bilateral hip degenerative change  IMPRESSION: No acute osseous abnormality. Electronically Signed   By: Luke Bun M.D.   On: 10/27/2023 23:15   DG Chest Port 1 View Result Date: 10/27/2023 CLINICAL DATA:  Fall EXAM: PORTABLE CHEST 1 VIEW COMPARISON:  02/04/2017 FINDINGS: No acute airspace disease or effusion. Stable cardiomediastinal silhouette with aortic atherosclerosis. No pneumothorax. IMPRESSION: No active disease. Electronically Signed   By: Luke Bun M.D.   On: 10/27/2023 23:15    {Document cardiac monitor, telemetry assessment procedure when appropriate:32947} Procedures   Medications Ordered in the ED - No data to display    {Click here for ABCD2, HEART and other calculators REFRESH Note before signing:1}                              Medical Decision Making  ***  {Document critical care time when appropriate  Document review of labs and clinical decision tools ie CHADS2VASC2, etc  Document your independent review of radiology images and any outside records  Document your discussion with family members, caretakers and with consultants  Document social determinants of health affecting pt's care  Document your decision making why or why not admission, treatments were needed:32947:::1}   Final diagnoses:  None    ED Discharge Orders     None

## 2023-10-27 NOTE — ED Triage Notes (Signed)
 Pt BIB EMS from outside of a bar called out by by standers. EMS reports unitnessed mechanical fall, pt uses walker and cane, roller walker wheel broken off on arrival. Unknown down time. No LOC. Denies thinners. A&Ox4 Eth use, reports two beers. Episode of incontinence. Hematoma to back of head. C/o right leg pain, neck pain, and head pain.   EMS applied collar pta, removed by pt.   EMS VS 118/76, HR 86, 98% RA, cbg 77

## 2023-10-27 NOTE — ED Notes (Signed)
 C-collar applied

## 2023-10-28 DIAGNOSIS — R296 Repeated falls: Secondary | ICD-10-CM | POA: Diagnosis not present

## 2023-10-28 NOTE — Discharge Planning (Signed)
 Sam Devonshire, BSN, RN, [facility] [phone] Pt qualifies for DME [facility]) rollator.  DME  ordered through [employer].  Ryan of [employer] notified to deliver DME to pt in lobby prior to D/C home.

## 2023-10-28 NOTE — ED Notes (Signed)
 Social worker contacted via telephone and adv. They will meet this patient in the waiting room. Pt requesting to see social worker to obtain new walker.

## 2023-11-09 ENCOUNTER — Telehealth: Payer: Self-pay

## 2023-11-09 NOTE — Telephone Encounter (Signed)
 Copied from CRM #[id number]. Topic: Clinical - Order For Equipment >> Nov 09, 2023 10:02 AM Ivette P wrote: Reason for CRM: Jeffrey [name] called in with Pt  because Rolator was stolen and would like to know what options he has about getting a replacement   Pls contact emergency contact. Pt currently does not have a mobile.   [place] - [phone]

## 2023-11-10 NOTE — Congregational Nurse Program (Signed)
  Dept: [phone]   Congregational Nurse Program Note  Date of Encounter: 11/09/2023  Previously was [employer] resident whose rollator was no longer usable.  Called MD office and a replacement will be ordered for him. Past Medical History: Past Medical History:  Diagnosis Date   Cataract    Glaucoma    Hearing deficit    Hepatitis C    Renal disorder     Encounter Details:  Community Questionnaire - 11/09/23 0950       Questionnaire   Ask client: Do you give verbal consent for me to treat you today? Yes    Student Assistance N/A    Location Patient Served  [facility]    Encounter Setting CN site    Population Status Unhoused    Insurance Medicaid;Medicare    Insurance/Financial Assistance Referral N/A    Medication N/A    Medical Provider Yes    Screening Referrals Made N/A    Medical Referrals Made [facility] PCP/Clinic    Medical Appointment Completed N/A    CNP Interventions Advocate/Support;Navigate Healthcare System;Case Management;Counsel    Screenings CN Performed N/A    ED Visit Averted N/A    Life-Saving Intervention Made N/A

## 2023-11-18 ENCOUNTER — Encounter (HOSPITAL_COMMUNITY): Payer: Self-pay

## 2023-11-18 ENCOUNTER — Other Ambulatory Visit: Payer: Self-pay

## 2023-11-18 ENCOUNTER — Emergency Department (HOSPITAL_COMMUNITY)

## 2023-11-18 ENCOUNTER — Emergency Department (HOSPITAL_COMMUNITY)
Admission: EM | Admit: 2023-11-18 | Discharge: 2023-11-19 | Disposition: A | Discharge status: Home / Self Care | Attending: Emergency Medicine | Admitting: Emergency Medicine

## 2023-11-18 DIAGNOSIS — W19XXXA Unspecified fall, initial encounter: Secondary | ICD-10-CM | POA: Diagnosis not present

## 2023-11-18 DIAGNOSIS — R079 Chest pain, unspecified: Secondary | ICD-10-CM | POA: Diagnosis not present

## 2023-11-18 DIAGNOSIS — Z743 Need for continuous supervision: Secondary | ICD-10-CM | POA: Diagnosis not present

## 2023-11-18 DIAGNOSIS — M25551 Pain in right hip: Secondary | ICD-10-CM | POA: Diagnosis not present

## 2023-11-18 NOTE — ED Provider Triage Note (Signed)
 Emergency Medicine Provider Triage Evaluation Note  Jeffrey [name] , a 68 y.o. male  was evaluated in triage.  Pt complains of 4 years of right hip pain.  Describes pain worse with movement.  States that he has been falling due to the pain and has landed on right hip.  Review of Systems  Positive:  Negative:   Physical Exam  BP 117/64 (BP Location: Right Arm)   Pulse 75   Temp 97.8 F (36.6 C) (Oral)   Resp 16   Ht 5' 6 (1.676 m)   Wt 59 kg   SpO2 98%   BMI 20.98 kg/m  Gen:   Awake, no distress   Resp:  Normal effort  MSK:   Moves extremities without difficulty, lateral right hip tenderness Other:    Medical Decision Making  Medically screening exam initiated at 10:50 PM.  Appropriate orders placed.  Jeffrey [name] was informed that the remainder of the evaluation will be completed by another provider, this initial triage assessment does not replace that evaluation, and the importance of remaining in the ED until their evaluation is complete.  X-rays ordered given recent falls onto the hip.   [name], [name], PA-C 11/18/23 2251

## 2023-11-18 NOTE — ED Triage Notes (Addendum)
 PER EMS: pt [place] the side of the street with c/o right hip pain x 2 years since falling 5 years ago. He is ambulatory with difficulty and endorses multiple falls today without injury.   BP- 126/74, HR-76, RR-16, 97% RA

## 2023-11-19 ENCOUNTER — Emergency Department (HOSPITAL_COMMUNITY)

## 2023-11-19 DIAGNOSIS — M25551 Pain in right hip: Secondary | ICD-10-CM | POA: Diagnosis not present

## 2023-11-19 MED ORDER — MELOXICAM 7.5 MG PO TABS: 15.0000 mg | ORAL_TABLET | Freq: Every day | ORAL | 0 refills | Status: AC

## 2023-11-19 NOTE — ED Provider Notes (Signed)
 Emergency Department Provider Note   I have reviewed the triage vital signs and the nursing notes.   HISTORY  Chief Complaint Hip Pain   HPI Jeffrey [name] is a 68 y.o. male past history reviewed below presents emergency department with right hip pain.  Notes he has had this pain for the past 2 years since a fall.  Pain is worse with touching the area or moving.  He has pain with ambulation which causes him to fall frequently.  Denies any falls today resulting in head trauma or loss of consciousness.  Is having some soreness in the right hip.  Does not believe he is followed with an orthopedic surgeon for this in the past.   Past Medical History:  Diagnosis Date   Cataract    Glaucoma    Hearing deficit    Hepatitis C    Renal disorder     Review of Systems  Constitutional: No fever/chills Cardiovascular: Denies chest pain. Respiratory: Denies shortness of breath. Gastrointestinal: No abdominal pain.  No nausea, no vomiting.   Musculoskeletal: Positive right hip pain.  Skin: Negative for rash. Neurological: Negative for headaches, focal weakness or numbness.   ____________________________________________   PHYSICAL EXAM:  VITAL SIGNS: ED Triage Vitals  Encounter Vitals Group     BP 11/18/23 2151 117/64     Pulse Rate 11/18/23 2151 75     Resp 11/18/23 2151 16     Temp 11/18/23 2151 97.8 F (36.6 C)     Temp Source 11/18/23 2151 Oral     SpO2 11/18/23 2151 98 %     Weight 11/18/23 2151 130 lb (59 kg)     Height 11/18/23 2151 5' 6 (1.676 m)   Constitutional: Alert and oriented. Well appearing and in no acute distress. Eyes: Conjunctivae are normal.  Head: Atraumatic. Nose: No congestion/rhinnorhea. Mouth/Throat: Mucous membranes are moist.   Neck: No stridor.  No cervical spine tenderness to palpation. Cardiovascular: Normal rate, regular rhythm. Good peripheral circulation. Grossly normal heart sounds.   Respiratory: Normal respiratory effort.  No  retractions. Lungs CTAB. Gastrointestinal: No distention.  Musculoskeletal: Normal range of motion of the right hip and knee.  Soft compartments. Neurologic:  Normal speech and language. No gross focal neurologic deficits are appreciated.  Skin:  Skin is warm, dry and intact. No rash noted.  ____________________________________________  RADIOLOGY  DG Hip Unilat W or Wo Pelvis 2-3 Views Right Result Date: 11/19/2023 CLINICAL DATA:  Right hip pain EXAM: DG HIP (WITH OR WITHOUT PELVIS) 2-3V RIGHT COMPARISON:  None Available. FINDINGS: There is no evidence of hip fracture or dislocation. There is no evidence of arthropathy or other focal bone abnormality. IMPRESSION: Negative. Electronically Signed   By: Dorethia Molt M.D.   On: 11/19/2023 05:40    ____________________________________________   PROCEDURES  Procedure(s) performed:   Procedures  None  ____________________________________________   INITIAL IMPRESSION / ASSESSMENT AND PLAN / ED COURSE  Pertinent labs & imaging results that were available during my care of the patient were reviewed by me and considered in my medical decision making (see chart for details).   This patient is Presenting for Evaluation of hip pain, which does require a range of treatment options, and is a complaint that involves a high risk of morbidity and mortality.  The Differential Diagnoses include contusion, fracture, dislocation, MSK strain, etc.  Radiologic Tests Ordered, included hip XR. I independently interpreted the images and agree with radiology interpretation.   Medical Decision Making: Summary:  Patient presents emergency department hip pain which is been ongoing for the past 2 years.  Fall today but reassuring exam.  X-ray of the right hip shows mild arthritis but no acute/traumatic changes.  Plan for symptom management.  Provided contact information for my orthopedist on-call today.   Patient's presentation is most consistent with  exacerbation of chronic illness.   Disposition: discharge  ____________________________________________  FINAL CLINICAL IMPRESSION(S) / ED DIAGNOSES  Final diagnoses:  Right hip pain     NEW OUTPATIENT MEDICATIONS STARTED DURING THIS VISIT:  Discharge Medication List as of 11/19/2023  6:26 AM     START taking these medications   Details  meloxicam  (MOBIC ) 7.5 MG tablet Take 2 tablets (15 mg total) by mouth daily for 7 days., Starting Fri 11/19/2023, Until Fri 11/26/2023, Normal        Note:  This document was prepared using Dragon voice recognition software and may include unintentional dictation errors.  Fonda Law, MD, [facility] Emergency Medicine    Askari Kinley, Fonda MATSU, MD 11/19/23 [phone]

## 2023-11-19 NOTE — Discharge Instructions (Signed)
 We believe that your symptoms are caused by musculoskeletal strain or bone pain in your joint.  Please read through the included information about additional care such as heating pads, over-the-counter pain medicine.  If you were provided a prescription please use it only as needed and as instructed.  Remember that early mobility and using the affected part of your body is actually better than keeping it immobile.  Follow-up with the doctor listed as recommended or return to the emergency department with new or worsening symptoms that concern you.

## 2023-11-28 ENCOUNTER — Emergency Department (HOSPITAL_COMMUNITY)
Admission: EM | Admit: 2023-11-28 | Discharge: 2023-11-28 | Disposition: A | Discharge status: Home / Self Care | Attending: Emergency Medicine | Admitting: Emergency Medicine

## 2023-11-28 ENCOUNTER — Emergency Department (HOSPITAL_COMMUNITY)

## 2023-11-28 ENCOUNTER — Encounter (HOSPITAL_COMMUNITY): Payer: Self-pay

## 2023-11-28 ENCOUNTER — Other Ambulatory Visit: Payer: Self-pay

## 2023-11-28 DIAGNOSIS — W050XXA Fall from non-moving wheelchair, initial encounter: Secondary | ICD-10-CM | POA: Insufficient documentation

## 2023-11-28 DIAGNOSIS — F10920 Alcohol use, unspecified with intoxication, uncomplicated: Secondary | ICD-10-CM

## 2023-11-28 DIAGNOSIS — R7309 Other abnormal glucose: Secondary | ICD-10-CM | POA: Diagnosis not present

## 2023-11-28 DIAGNOSIS — S0990XA Unspecified injury of head, initial encounter: Secondary | ICD-10-CM | POA: Diagnosis not present

## 2023-11-28 DIAGNOSIS — F17200 Nicotine dependence, unspecified, uncomplicated: Secondary | ICD-10-CM | POA: Diagnosis not present

## 2023-11-28 DIAGNOSIS — S0101XA Laceration without foreign body of scalp, initial encounter: Secondary | ICD-10-CM | POA: Diagnosis not present

## 2023-11-28 DIAGNOSIS — G8929 Other chronic pain: Secondary | ICD-10-CM | POA: Insufficient documentation

## 2023-11-28 DIAGNOSIS — S199XXA Unspecified injury of neck, initial encounter: Secondary | ICD-10-CM | POA: Diagnosis not present

## 2023-11-28 DIAGNOSIS — F10120 Alcohol abuse with intoxication, uncomplicated: Secondary | ICD-10-CM | POA: Insufficient documentation

## 2023-11-28 DIAGNOSIS — N189 Chronic kidney disease, unspecified: Secondary | ICD-10-CM | POA: Insufficient documentation

## 2023-11-28 DIAGNOSIS — Z23 Encounter for immunization: Secondary | ICD-10-CM | POA: Diagnosis not present

## 2023-11-28 DIAGNOSIS — W19XXXA Unspecified fall, initial encounter: Secondary | ICD-10-CM

## 2023-11-28 DIAGNOSIS — Y905 Blood alcohol level of 100-119 mg/100 ml: Secondary | ICD-10-CM | POA: Insufficient documentation

## 2023-11-28 DIAGNOSIS — R9431 Abnormal electrocardiogram [ECG] [EKG]: Secondary | ICD-10-CM | POA: Diagnosis not present

## 2023-11-28 DIAGNOSIS — Z043 Encounter for examination and observation following other accident: Secondary | ICD-10-CM | POA: Diagnosis not present

## 2023-11-28 DIAGNOSIS — J439 Emphysema, unspecified: Secondary | ICD-10-CM | POA: Diagnosis not present

## 2023-11-28 LAB — COMPREHENSIVE METABOLIC PANEL WITH GFR
ALT: 53 U/L — ABNORMAL HIGH (ref 0–44)
AST: 73 U/L — ABNORMAL HIGH (ref 15–41)
Albumin: 3.4 g/dL — ABNORMAL LOW (ref 3.5–5.0)
Alkaline Phosphatase: 104 U/L (ref 38–126)
Anion gap: 11 (ref 5–15)
BUN: 22 mg/dL (ref 8–23)
CO2: 19 mmol/L — ABNORMAL LOW (ref 22–32)
Calcium: 9.4 mg/dL (ref 8.9–10.3)
Chloride: 107 mmol/L (ref 98–111)
Creatinine, Ser: 1.88 mg/dL — ABNORMAL HIGH (ref 0.61–1.24)
GFR, Estimated: 39 mL/min — ABNORMAL LOW (ref >60)
Glucose, Bld: 84 mg/dL (ref 70–99)
Potassium: 3.7 mmol/L (ref 3.5–5.1)
Sodium: 137 mmol/L (ref 135–145)
Total Bilirubin: 0.5 mg/dL (ref 0.0–1.2)
Total Protein: 6.9 g/dL (ref 6.5–8.1)

## 2023-11-28 LAB — CBC
HCT: 34.9 % — ABNORMAL LOW (ref 39.0–52.0)
Hemoglobin: 11.5 g/dL — ABNORMAL LOW (ref 13.0–17.0)
MCH: 33.3 pg (ref 26.0–34.0)
MCHC: 33 g/dL (ref 30.0–36.0)
MCV: 101.2 fL — ABNORMAL HIGH (ref 80.0–100.0)
Platelets: 113 K/uL — ABNORMAL LOW (ref 150–400)
RBC: 3.45 MIL/uL — ABNORMAL LOW (ref 4.22–5.81)
RDW: 14.1 % (ref 11.5–15.5)
WBC: 4.5 K/uL (ref 4.0–10.5)
nRBC: 0 % (ref 0.0–0.2)

## 2023-11-28 LAB — CBG MONITORING, ED: Glucose-Capillary: 94 mg/dL (ref 70–99)

## 2023-11-28 LAB — ETHANOL: Alcohol, Ethyl (B): 117 mg/dL — ABNORMAL HIGH (ref <15)

## 2023-11-28 MED ORDER — LIDOCAINE-EPINEPHRINE (PF) 2 %-1:200000 IJ SOLN
10.0000 mL | Freq: Once | INTRAMUSCULAR | Status: DC
  Filled 2023-11-28: qty 20

## 2023-11-28 MED ORDER — TETANUS-DIPHTH-ACELL PERTUSSIS 5-2.5-18.5 LF-MCG/0.5 IM SUSY
0.5000 mL | PREFILLED_SYRINGE | Freq: Once | INTRAMUSCULAR | Status: AC
  Administered 2023-11-28: 0.5 mL via INTRAMUSCULAR
  Filled 2023-11-28: qty 0.5

## 2023-11-28 NOTE — ED Provider Notes (Signed)
 [place] EMERGENCY DEPARTMENT AT [facility] Provider Note   CSN: [id number] Arrival date & time: 11/28/23  1818     Patient presents with: Jeffrey [name] Kennyth is a 68 y.o. male with past medical history seen for hepatitis, CKD, alcohol dependence, tobacco abuse, alcohol cirrhosis, frequent falls, sheltered homelessness who presents after mechanical fall today out of wheelchair/walker onto ground.  Laceration noted to middle forehead.  He reports that he has chronic right hip pain that is worsened, makes it difficult for him to walk.  Unsure when his last tetanus shot was.  Does not take a blood thinner.  Denies loss of consciousness.    Fall       Prior to Admission medications   Medication Sig Start Date End Date Taking? Authorizing Provider  Glecaprevir -Pibrentasvir  (MAVYRET ) 100-40 MG TABS Take 3 tablets by mouth daily with breakfast. 09/09/23   Waddell Alan PARAS, RPH-CPP  predniSONE  (DELTASONE ) 10 MG tablet Take 4 tablets (40 mg total) by mouth daily for 5 days then stop. 09/09/23   Brien Belvie FORBES, MD    Allergies: Patient has no known allergies.    Review of Systems  All other systems reviewed and are negative.   Updated Vital Signs BP (!) 145/77   Pulse 83   Temp 98 F (36.7 C)   Resp 17   Ht 5' 5 (1.651 m)   Wt 63.5 kg   SpO2 100%   BMI 23.30 kg/m   Physical Exam Vitals and nursing note reviewed.  Constitutional:      General: He is not in acute distress.    Appearance: Normal appearance.  HENT:     Head: Normocephalic and atraumatic.  Eyes:     General:        Right eye: No discharge.        Left eye: No discharge.  Cardiovascular:     Rate and Rhythm: Normal rate and regular rhythm.     Heart sounds: No murmur heard.    No friction rub. No gallop.  Pulmonary:     Effort: Pulmonary effort is normal.     Breath sounds: Normal breath sounds.  Abdominal:     General: Bowel sounds are normal.     Palpations: Abdomen is soft.  Skin:     General: Skin is warm and dry.     Capillary Refill: Capillary refill takes less than 2 seconds.     Comments: 3 to 4 cm laceration in the center of the forehead with mild oozing.  No other significant head injury noted.  Small abrasion near the larger laceration on the left lateral forehead.  Neurological:     Mental Status: He is alert and oriented to person, place, and time.  Psychiatric:        Mood and Affect: Mood normal.        Behavior: Behavior normal.     (all labs ordered are listed, but only abnormal results are displayed) Labs Reviewed  CBC - Abnormal; Notable for the following components:      Result Value   RBC 3.45 (*)    Hemoglobin 11.5 (*)    HCT 34.9 (*)    MCV 101.2 (*)    Platelets 113 (*)    All other components within normal limits  ETHANOL  COMPREHENSIVE METABOLIC PANEL WITH GFR  CBG MONITORING, ED    EKG: EKG Interpretation Date/Time:  Sunday November 28 2023 19:48:12 EDT Ventricular Rate:  67 PR Interval:  140 QRS Duration:  117 QT Interval:  440 QTC Calculation: 465 R Axis:   89  Text Interpretation: Sinus rhythm Incomplete right bundle branch block ST elevations noted on prior tracing October 27 2023, no significant changes Baseline wander noted Confirmed by Cottie Cough [phone]) on 11/28/2023 7:52:29 PM  Radiology: [facility] Hip Unilat W or Wo Pelvis 2-3 Views Right Result Date: 11/28/2023 CLINICAL DATA:  fall EXAM: DG HIP (WITH OR WITHOUT PELVIS) 2-3V RIGHT COMPARISON:  X-ray pelvis 11/19/2023 FINDINGS: There is no evidence of hip fracture or dislocation of the right hip. No acute displaced fracture or dislocation of left hip on frontal view. No acute displaced fracture or diastasis of the bones of the pelvis. There is no evidence of arthropathy or other focal bone abnormality. IMPRESSION: Negative for acute traumatic injury. Electronically Signed   By: Morgane  Naveau M.D.   On: 11/28/2023 19:28   CT Head Wo Contrast Result Date: 11/28/2023 CLINICAL  DATA:  Head trauma, minor (Age >= 65y); Neck trauma (Age >= 65y) Fall, lac to forehead EXAM: CT HEAD WITHOUT CONTRAST CT CERVICAL SPINE WITHOUT CONTRAST TECHNIQUE: Multidetector CT imaging of the head and cervical spine was performed following the standard protocol without intravenous contrast. Multiplanar CT image reconstructions of the cervical spine were also generated. RADIATION DOSE REDUCTION: This exam was performed according to the departmental dose-optimization program which includes automated exposure control, adjustment of the mA and/or kV according to patient size and/or use of iterative reconstruction technique. COMPARISON:  CT head and C-spine 10/27/2023 FINDINGS: CT HEAD FINDINGS Brain: Stable prominence of the lateral ventricles may be related to central predominant atrophy, although a component of normal pressure/communicating hydrocephalus cannot be excluded. Patchy and confluent areas of decreased attenuation are noted throughout the deep and periventricular white matter of the cerebral hemispheres bilaterally, compatible with chronic microvascular ischemic disease. No evidence of large-territorial acute infarction. No parenchymal hemorrhage. No mass lesion. No extra-axial collection. No mass effect or midline shift. No hydrocephalus. Basilar cisterns are patent. Vascular: No hyperdense vessel. Skull: No acute fracture or focal lesion. Sinuses/Orbits: Paranasal sinuses and mastoid air cells are clear. The orbits are unremarkable. Other: None. CT CERVICAL SPINE FINDINGS Alignment: Reversal of the normal cervical lordosis likely due to positioning and degenerative changes. Mild retrolisthesis of C3 on C4, C4 on C5, C5 on C6. Grade 1 anterolisthesis of C7 on T1. Skull base and vertebrae: Multilevel moderate degenerative changes of the spine. No acute fracture. No aggressive appearing focal osseous lesion or focal pathologic process. Soft tissues and spinal canal: No prevertebral fluid or swelling. No  visible canal hematoma. Upper chest: Biapical paraseptal and centrilobular emphysematous changes. Other: None. IMPRESSION: 1. No acute intracranial abnormality. 2. No acute displaced fracture or traumatic listhesis of the cervical spine. 3. Stable prominence of the lateral ventricles may be related to central predominant atrophy, although a component of normal pressure/communicating hydrocephalus cannot be excluded. 4.  Emphysema (ICD10-J43.9). Electronically Signed   By: Morgane  Naveau M.D.   On: 11/28/2023 19:27   CT Cervical Spine Wo Contrast Result Date: 11/28/2023 CLINICAL DATA:  Head trauma, minor (Age >= 65y); Neck trauma (Age >= 65y) Fall, lac to forehead EXAM: CT HEAD WITHOUT CONTRAST CT CERVICAL SPINE WITHOUT CONTRAST TECHNIQUE: Multidetector CT imaging of the head and cervical spine was performed following the standard protocol without intravenous contrast. Multiplanar CT image reconstructions of the cervical spine were also generated. RADIATION DOSE REDUCTION: This exam was performed according to the departmental dose-optimization program which includes automated  exposure control, adjustment of the mA and/or kV according to patient size and/or use of iterative reconstruction technique. COMPARISON:  CT head and C-spine 10/27/2023 FINDINGS: CT HEAD FINDINGS Brain: Stable prominence of the lateral ventricles may be related to central predominant atrophy, although a component of normal pressure/communicating hydrocephalus cannot be excluded. Patchy and confluent areas of decreased attenuation are noted throughout the deep and periventricular white matter of the cerebral hemispheres bilaterally, compatible with chronic microvascular ischemic disease. No evidence of large-territorial acute infarction. No parenchymal hemorrhage. No mass lesion. No extra-axial collection. No mass effect or midline shift. No hydrocephalus. Basilar cisterns are patent. Vascular: No hyperdense vessel. Skull: No acute fracture  or focal lesion. Sinuses/Orbits: Paranasal sinuses and mastoid air cells are clear. The orbits are unremarkable. Other: None. CT CERVICAL SPINE FINDINGS Alignment: Reversal of the normal cervical lordosis likely due to positioning and degenerative changes. Mild retrolisthesis of C3 on C4, C4 on C5, C5 on C6. Grade 1 anterolisthesis of C7 on T1. Skull base and vertebrae: Multilevel moderate degenerative changes of the spine. No acute fracture. No aggressive appearing focal osseous lesion or focal pathologic process. Soft tissues and spinal canal: No prevertebral fluid or swelling. No visible canal hematoma. Upper chest: Biapical paraseptal and centrilobular emphysematous changes. Other: None. IMPRESSION: 1. No acute intracranial abnormality. 2. No acute displaced fracture or traumatic listhesis of the cervical spine. 3. Stable prominence of the lateral ventricles may be related to central predominant atrophy, although a component of normal pressure/communicating hydrocephalus cannot be excluded. 4.  Emphysema (ICD10-J43.9). Electronically Signed   By: Morgane  Naveau M.D.   On: 11/28/2023 19:27     Procedures   Medications Ordered in the ED  lidocaine -EPINEPHrine  (XYLOCAINE  W/EPI) 2 %-1:200000 (PF) injection 10 mL (has no administration in time range)  Tdap (BOOSTRIX ) injection 0.5 mL (0.5 mLs Intramuscular Given 11/28/23 1900)                                    Medical Decision Making Amount and/or Complexity of Data Reviewed Labs: ordered. Radiology: ordered.  Risk Prescription drug management.   This patient is a 68 y.o. male  who presents to the ED for concern of fall, head injury, alcohol intoxication  Differential diagnoses prior to evaluation: The emergent differential diagnosis includes, but is not limited to,  epidural hematoma, subdural hematoma, skull fracture, subarachnoid hemorrhage, unstable cervical spine fracture, concussion vs other MSK injury  . This is not an exhaustive  differential.   Past Medical History / Co-morbidities / Social History:  hepatitis, CKD, alcohol dependence, tobacco abuse, alcohol cirrhosis, frequent falls, sheltered homelessness  Additional history: Chart reviewed. Pertinent results include: Reviewed lab work, imaging from previous emergency room visits  Physical Exam: Physical exam performed. The pertinent findings include: Clinically intoxicated, mild tenderness of the right hip with normal range of motion, normal appearance of the skin overlying the right hip.  He has some soft tissue swelling of the forehead and around 3 to 4 cm dehisced laceration with mild oozing.  Vital signs stable.  Lab Tests/Imaging studies: I personally interpreted labs/imaging and the pertinent results include: I independently interpreted plain film radiograph of the right hip, CT head, CT C-spine.  He has some enlarged ventricles noted, no skull fracture, no intracranial bleeding, no acute fracture or dislocation of the right hip. I agree with the radiologist interpretation.  CBC with mild anemia, hemoglobin 11.5, platelets decreased at  113.  Normal CBG at 94.  Cardiac monitoring: EKG obtained and interpreted by myself and attending physician which shows: Normal sinus rhythm, no significant change from previous tracing   Medications: Tetanus updated today.  I attempted to repair the patient's laceration on his forehead but he vehemently declined.  Discussed risk of leaving a dehisced wound without repair.  He did allow me to thoroughly irrigate the wound, but continues to decline sutures.  Extensive discussion about risks of infection, poor wound healing.   9:05 PM Care of ICHAEL PULLARA transferred to [facility] and Dr. Ginger at the end of my shift as the patient will require reassessment once labs/imaging have resulted. Patient presentation, ED course, and plan of care discussed with review of all pertinent labs and imaging. Please see his/her note  for further details regarding further ED course and disposition. Plan at time of handoff is patient does not have a means of safe transport, will need monitoring in the emergency department for sobriety, ability to walk with his normal assistive devices prior to discharge.. This may be altered or completely changed at the discretion of the oncoming team pending results of further workup.    Final diagnoses:  None    ED Discharge Orders     None          Rosan Sherlean VEAR DEVONNA 11/28/23 2105    Cottie Donnice PARAS, MD 11/30/23 [phone]

## 2023-11-28 NOTE — ED Provider Notes (Signed)
 Care assumed from previous provider.  See note for full HPI.  In summation 68 year old here for evaluation of fall.  He has a history of frequent falls, EtOH intoxication, homelessness.  Uses a wheelchair/walker.  He suffered a laceration of his forehead.  Patient's labs do show EtOH intoxication, no traumatic injury.  Patient had a laceration of forehead however did not want suturing was closed with Steri-Strips by peers provider.  Plan on ambulate patient, DC home afterwards. Physical Exam  BP (!) 137/99   Pulse 83   Temp 98 F (36.7 C)   Resp 17   Ht 5' 5 (1.651 m)   Wt 63.5 kg   SpO2 100%   BMI 23.30 kg/m   Physical Exam Vitals and nursing note reviewed.  Constitutional:      General: He is not in acute distress.    Appearance: He is well-developed. He is not ill-appearing or diaphoretic.  HENT:     Head:     Comments: Steri-Strips central forehead, no active bleeding. Eyes:     Pupils: Pupils are equal, round, and reactive to light.  Cardiovascular:     Rate and Rhythm: Normal rate and regular rhythm.  Pulmonary:     Effort: Pulmonary effort is normal. No respiratory distress.  Abdominal:     General: There is no distension.     Palpations: Abdomen is soft.  Musculoskeletal:        General: Normal range of motion.     Cervical back: Normal range of motion and neck supple.  Skin:    General: Skin is warm.  Neurological:     General: No focal deficit present.     Mental Status: He is alert.     Comments: Walker for ambulation     Procedures  Procedures Labs Reviewed  ETHANOL - Abnormal; Notable for the following components:      Result Value   Alcohol, Ethyl (B) 117 (*)    All other components within normal limits  COMPREHENSIVE METABOLIC PANEL WITH GFR - Abnormal; Notable for the following components:   CO2 19 (*)    Creatinine, Ser 1.88 (*)    Albumin 3.4 (*)    AST 73 (*)    ALT 53 (*)    GFR, Estimated 39 (*)    All other components within normal  limits  CBC - Abnormal; Notable for the following components:   RBC 3.45 (*)    Hemoglobin 11.5 (*)    HCT 34.9 (*)    MCV 101.2 (*)    Platelets 113 (*)    All other components within normal limits  CBG MONITORING, ED   DG Hip Unilat W or Wo Pelvis 2-3 Views Right Result Date: 11/28/2023 CLINICAL DATA:  fall EXAM: DG HIP (WITH OR WITHOUT PELVIS) 2-3V RIGHT COMPARISON:  X-ray pelvis 11/19/2023 FINDINGS: There is no evidence of hip fracture or dislocation of the right hip. No acute displaced fracture or dislocation of left hip on frontal view. No acute displaced fracture or diastasis of the bones of the pelvis. There is no evidence of arthropathy or other focal bone abnormality. IMPRESSION: Negative for acute traumatic injury. Electronically Signed   By: Morgane  Naveau M.D.   On: 11/28/2023 19:28   CT Head Wo Contrast Result Date: 11/28/2023 CLINICAL DATA:  Head trauma, minor (Age >= 65y); Neck trauma (Age >= 65y) Fall, lac to forehead EXAM: CT HEAD WITHOUT CONTRAST CT CERVICAL SPINE WITHOUT CONTRAST TECHNIQUE: Multidetector CT imaging of the head and  cervical spine was performed following the standard protocol without intravenous contrast. Multiplanar CT image reconstructions of the cervical spine were also generated. RADIATION DOSE REDUCTION: This exam was performed according to the departmental dose-optimization program which includes automated exposure control, adjustment of the mA and/or kV according to patient size and/or use of iterative reconstruction technique. COMPARISON:  CT head and C-spine 10/27/2023 FINDINGS: CT HEAD FINDINGS Brain: Stable prominence of the lateral ventricles may be related to central predominant atrophy, although a component of normal pressure/communicating hydrocephalus cannot be excluded. Patchy and confluent areas of decreased attenuation are noted throughout the deep and periventricular white matter of the cerebral hemispheres bilaterally, compatible with chronic  microvascular ischemic disease. No evidence of large-territorial acute infarction. No parenchymal hemorrhage. No mass lesion. No extra-axial collection. No mass effect or midline shift. No hydrocephalus. Basilar cisterns are patent. Vascular: No hyperdense vessel. Skull: No acute fracture or focal lesion. Sinuses/Orbits: Paranasal sinuses and mastoid air cells are clear. The orbits are unremarkable. Other: None. CT CERVICAL SPINE FINDINGS Alignment: Reversal of the normal cervical lordosis likely due to positioning and degenerative changes. Mild retrolisthesis of C3 on C4, C4 on C5, C5 on C6. Grade 1 anterolisthesis of C7 on T1. Skull base and vertebrae: Multilevel moderate degenerative changes of the spine. No acute fracture. No aggressive appearing focal osseous lesion or focal pathologic process. Soft tissues and spinal canal: No prevertebral fluid or swelling. No visible canal hematoma. Upper chest: Biapical paraseptal and centrilobular emphysematous changes. Other: None. IMPRESSION: 1. No acute intracranial abnormality. 2. No acute displaced fracture or traumatic listhesis of the cervical spine. 3. Stable prominence of the lateral ventricles may be related to central predominant atrophy, although a component of normal pressure/communicating hydrocephalus cannot be excluded. 4.  Emphysema (ICD10-J43.9). Electronically Signed   By: Morgane  Naveau M.D.   On: 11/28/2023 19:27   CT Cervical Spine Wo Contrast Result Date: 11/28/2023 CLINICAL DATA:  Head trauma, minor (Age >= 65y); Neck trauma (Age >= 65y) Fall, lac to forehead EXAM: CT HEAD WITHOUT CONTRAST CT CERVICAL SPINE WITHOUT CONTRAST TECHNIQUE: Multidetector CT imaging of the head and cervical spine was performed following the standard protocol without intravenous contrast. Multiplanar CT image reconstructions of the cervical spine were also generated. RADIATION DOSE REDUCTION: This exam was performed according to the departmental dose-optimization  program which includes automated exposure control, adjustment of the mA and/or kV according to patient size and/or use of iterative reconstruction technique. COMPARISON:  CT head and C-spine 10/27/2023 FINDINGS: CT HEAD FINDINGS Brain: Stable prominence of the lateral ventricles may be related to central predominant atrophy, although a component of normal pressure/communicating hydrocephalus cannot be excluded. Patchy and confluent areas of decreased attenuation are noted throughout the deep and periventricular white matter of the cerebral hemispheres bilaterally, compatible with chronic microvascular ischemic disease. No evidence of large-territorial acute infarction. No parenchymal hemorrhage. No mass lesion. No extra-axial collection. No mass effect or midline shift. No hydrocephalus. Basilar cisterns are patent. Vascular: No hyperdense vessel. Skull: No acute fracture or focal lesion. Sinuses/Orbits: Paranasal sinuses and mastoid air cells are clear. The orbits are unremarkable. Other: None. CT CERVICAL SPINE FINDINGS Alignment: Reversal of the normal cervical lordosis likely due to positioning and degenerative changes. Mild retrolisthesis of C3 on C4, C4 on C5, C5 on C6. Grade 1 anterolisthesis of C7 on T1. Skull base and vertebrae: Multilevel moderate degenerative changes of the spine. No acute fracture. No aggressive appearing focal osseous lesion or focal pathologic process. Soft tissues and spinal  canal: No prevertebral fluid or swelling. No visible canal hematoma. Upper chest: Biapical paraseptal and centrilobular emphysematous changes. Other: None. IMPRESSION: 1. No acute intracranial abnormality. 2. No acute displaced fracture or traumatic listhesis of the cervical spine. 3. Stable prominence of the lateral ventricles may be related to central predominant atrophy, although a component of normal pressure/communicating hydrocephalus cannot be excluded. 4.  Emphysema (ICD10-J43.9). Electronically Signed    By: Morgane  Naveau M.D.   On: 11/28/2023 19:27   DG Hip Unilat W or Wo Pelvis 2-3 Views Right Result Date: 11/19/2023 CLINICAL DATA:  Right hip pain EXAM: DG HIP (WITH OR WITHOUT PELVIS) 2-3V RIGHT COMPARISON:  None Available. FINDINGS: There is no evidence of hip fracture or dislocation. There is no evidence of arthropathy or other focal bone abnormality. IMPRESSION: Negative. Electronically Signed   By: Dorethia Molt M.D.   On: 11/19/2023 05:40    ED Course / MDM   Clinical Course as of 11/28/23 2214  Sun Nov 28, 2023  2108 Obs, ambulate, dc home- lac does not want close has steri strips [BH]    Clinical Course User Index [BH] Takeo Harts A, PA-C   Care assumed from previous provider.  See note for full HPI.  In summation 68 year old here for evaluation of fall.  He has a history of frequent falls, EtOH intoxication, homelessness.  Uses a wheelchair/walker.  He suffered a laceration of his forehead.  Patient's labs do show EtOH intoxication, no traumatic injury.  Patient had a laceration of forehead however did not want suturing was closed with Steri-Strips by peers provider.  Plan on ambulate patient, DC home afterwards.  Patient ambulatory here with walker at his baseline.  Will have him follow-up outpatient, return for any worsening symptoms   Medical Decision Making Amount and/or Complexity of Data Reviewed External Data Reviewed: labs, radiology and notes. Labs: ordered. Decision-making details documented in ED Course. Radiology: ordered and independent interpretation performed. Decision-making details documented in ED Course.  Risk OTC drugs. Prescription drug management. Decision regarding hospitalization. Diagnosis or treatment significantly limited by social determinants of health.         Edie Rosebud LABOR, PA-C 11/28/23 2214    Tegeler, Lonni PARAS, MD 11/28/23 2322

## 2023-11-28 NOTE — Discharge Instructions (Signed)
 It was a pleasure taking care of you here today  I would limit your alcohol consumption as it can increase your falls  Follow-up outpatient, return for any worsening symptoms

## 2023-11-28 NOTE — ED Triage Notes (Signed)
 Pt arrived via EMS CC of mechanical fall today out of WC/walker onto ground. Laceration in middle of forehead, bleeding controlled. Denies blood thinners, did use ETOH today. Denies LOC. VSS and AxoX3. Pupils equal no unilateral deficits.

## 2023-11-28 NOTE — ED Notes (Signed)
 Patient transported to CT

## 2023-12-16 ENCOUNTER — Emergency Department (HOSPITAL_COMMUNITY)
Admission: EM | Admit: 2023-12-16 | Discharge: 2023-12-17 | Discharge status: Against Medical Advice | Attending: Emergency Medicine | Admitting: Emergency Medicine

## 2023-12-16 ENCOUNTER — Emergency Department (HOSPITAL_COMMUNITY)

## 2023-12-16 ENCOUNTER — Other Ambulatory Visit: Payer: Self-pay

## 2023-12-16 ENCOUNTER — Encounter (HOSPITAL_COMMUNITY): Payer: Self-pay | Admitting: Emergency Medicine

## 2023-12-16 DIAGNOSIS — M25551 Pain in right hip: Secondary | ICD-10-CM | POA: Diagnosis not present

## 2023-12-16 DIAGNOSIS — M160 Bilateral primary osteoarthritis of hip: Secondary | ICD-10-CM | POA: Diagnosis not present

## 2023-12-16 DIAGNOSIS — R519 Headache, unspecified: Secondary | ICD-10-CM | POA: Diagnosis not present

## 2023-12-16 DIAGNOSIS — M85851 Other specified disorders of bone density and structure, right thigh: Secondary | ICD-10-CM | POA: Diagnosis not present

## 2023-12-16 DIAGNOSIS — M542 Cervicalgia: Secondary | ICD-10-CM | POA: Diagnosis not present

## 2023-12-16 DIAGNOSIS — W19XXXA Unspecified fall, initial encounter: Secondary | ICD-10-CM | POA: Diagnosis not present

## 2023-12-16 DIAGNOSIS — S0003XA Contusion of scalp, initial encounter: Secondary | ICD-10-CM | POA: Insufficient documentation

## 2023-12-16 DIAGNOSIS — S0990XA Unspecified injury of head, initial encounter: Secondary | ICD-10-CM | POA: Diagnosis not present

## 2023-12-16 DIAGNOSIS — Z5321 Procedure and treatment not carried out due to patient leaving prior to being seen by health care provider: Secondary | ICD-10-CM | POA: Insufficient documentation

## 2023-12-16 DIAGNOSIS — I6782 Cerebral ischemia: Secondary | ICD-10-CM | POA: Diagnosis not present

## 2023-12-16 DIAGNOSIS — M25572 Pain in left ankle and joints of left foot: Secondary | ICD-10-CM | POA: Diagnosis not present

## 2023-12-16 MED ORDER — OXYCODONE-ACETAMINOPHEN 5-325 MG PO TABS
1.0000 | ORAL_TABLET | Freq: Once | ORAL | Status: AC
  Administered 2023-12-16: 1 via ORAL
  Filled 2023-12-16: qty 1

## 2023-12-16 NOTE — ED Triage Notes (Signed)
 Pt in from bus stop via [facility] after reported fall. Per EMS, pt has a hematoma to posterior head but pt states this is not new as he constantly hits his head there. No LOC or thinners reported. Arrives a&ox4, also c/o R hip pain and asking for sandwich on arrival. EMS VS: 110/70 80HR 18RR 98%RA CBG 101

## 2023-12-16 NOTE — ED Provider Triage Note (Signed)
 Emergency Medicine Provider Triage Evaluation Note  Jeffrey [name] , a 68 y.o. male  was evaluated in triage.  Pt complains of headache and right hip pain after falling tonight [place] bus [place].  Patient reports for 4 years he has had ongoing pain in his hip with recurrent falls.  He uses his walker but sometimes falls even with that.  Tonight he fell yet again.  He is having pain in the right hip but denies any knee or ankle tenderness.  No neck tenderness.  Patient does not use anticoagulation  Review of Systems  Positive: Hip pain, headache, fall Negative: Chest pain, shortness of breath or abdominal pain  Physical Exam  BP 118/71 (BP Location: Right Arm)   Pulse 82   Temp 98 F (36.7 C) (Oral)   Resp 18   Wt 63.5 kg   SpO2 100%   BMI 23.30 kg/m  Gen:   Awake, no distress   Resp:  Normal effort  MSK:   Pain in the right hip with palpation with diminished movement.  Normal right knee and ankle.  Pulses present.  No neck pain. Other:  No obvious hematoma or trauma to the scalp.  Medical Decision Making  Medically screening exam initiated at 8:53 PM.  Appropriate orders placed.  Jeffrey [name] was informed that the remainder of the evaluation will be completed by another provider, this initial triage assessment does not replace that evaluation, and the importance of remaining in the ED until their evaluation is complete.     Doretha Folks, MD 12/16/23 2055

## 2023-12-17 NOTE — ED Notes (Signed)
 Called pt twice for vitals check..no response

## 2023-12-20 ENCOUNTER — Telehealth: Payer: Self-pay

## 2023-12-20 NOTE — Telephone Encounter (Signed)
 Can we re open the referral or does it need to be resent under Dr. Millard name.

## 2023-12-20 NOTE — Telephone Encounter (Signed)
 Patient is requesting a referral to an Orthopedic doctor states he has lower back pain, advised that he had a referral sent back in June but its says closed and he might need an appointment for the new referral.

## 2023-12-21 NOTE — Telephone Encounter (Signed)
 Call placed to patient to give contact information. Patient states that he will call back and get the information for the orthopedic referral.

## 2024-01-05 ENCOUNTER — Telehealth: Payer: Self-pay | Admitting: Critical Care Medicine

## 2024-01-05 NOTE — Telephone Encounter (Signed)
 Pt stated that he would call us  back to let us  know if he can make appt.

## 2024-01-07 ENCOUNTER — Telehealth: Payer: Self-pay | Admitting: Family Medicine

## 2024-01-07 NOTE — Telephone Encounter (Signed)
 1st attempt contacted pt resch appt due to pcp not in the office

## 2024-01-10 ENCOUNTER — Ambulatory Visit: Admitting: Family Medicine

## 2024-01-24 ENCOUNTER — Ambulatory Visit: Admitting: Family Medicine

## 2024-01-28 ENCOUNTER — Other Ambulatory Visit: Payer: Self-pay

## 2024-02-04 ENCOUNTER — Ambulatory Visit: Admitting: Family Medicine

## 2024-03-17 ENCOUNTER — Other Ambulatory Visit: Payer: Self-pay | Admitting: Pharmacist

## 2024-03-17 NOTE — Progress Notes (Signed)
 Clinical team has not been able to reach patient or reps from [facility] to take medication. Will disenroll at this time.   Alan Geralds, PharmD, CPP, BCIDP, AAHIVP Clinical Pharmacist Practitioner Infectious Diseases Clinical Pharmacist [facility]

## 2024-04-03 ENCOUNTER — Encounter: Payer: Self-pay | Admitting: Physician Assistant

## 2024-04-03 NOTE — Progress Notes (Signed)
 Pt here because he is having pain R hip and lower back.  He has had this for a long time, last Xrays were taken is Sep 2025.  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   Pt pain is mainly on the R and in his back. It makes it hard for him to get out of bed and move around.   He was offered Tylenol  but said that did not work, none given.  Says quit taking Mavyret  because of side effects.   It was explained to him that he needs a PCP appt >> Ortho referral. The Ortho MD is the one that can treat his joint issues.  It was explained that he missed 3 different PCP appts, we can set another one up, but he must keep it.   Pt says he will.  Wrote out DJD on paper at his request.   Shona Shad, PA-C 04/03/2024 10:04 AM

## 2024-04-06 ENCOUNTER — Emergency Department (HOSPITAL_COMMUNITY)

## 2024-04-06 ENCOUNTER — Other Ambulatory Visit: Payer: Self-pay

## 2024-04-06 ENCOUNTER — Inpatient Hospital Stay (HOSPITAL_COMMUNITY)
Admission: EM | Admit: 2024-04-06 | Discharge: 2024-04-13 | DRG: 871 | Disposition: A | Discharge status: Home / Self Care | Attending: Internal Medicine | Admitting: Internal Medicine

## 2024-04-06 DIAGNOSIS — A4189 Other specified sepsis: Principal | ICD-10-CM | POA: Diagnosis present

## 2024-04-06 DIAGNOSIS — N1832 Chronic kidney disease, stage 3b: Secondary | ICD-10-CM | POA: Diagnosis present

## 2024-04-06 DIAGNOSIS — B182 Chronic viral hepatitis C: Secondary | ICD-10-CM | POA: Diagnosis present

## 2024-04-06 DIAGNOSIS — W19XXXA Unspecified fall, initial encounter: Secondary | ICD-10-CM | POA: Diagnosis present

## 2024-04-06 DIAGNOSIS — G8929 Other chronic pain: Secondary | ICD-10-CM | POA: Diagnosis present

## 2024-04-06 DIAGNOSIS — M25551 Pain in right hip: Secondary | ICD-10-CM | POA: Diagnosis present

## 2024-04-06 DIAGNOSIS — N179 Acute kidney failure, unspecified: Secondary | ICD-10-CM | POA: Diagnosis present

## 2024-04-06 DIAGNOSIS — J101 Influenza due to other identified influenza virus with other respiratory manifestations: Secondary | ICD-10-CM | POA: Diagnosis present

## 2024-04-06 DIAGNOSIS — J102 Influenza due to other identified influenza virus with gastrointestinal manifestations: Secondary | ICD-10-CM | POA: Diagnosis present

## 2024-04-06 DIAGNOSIS — R296 Repeated falls: Secondary | ICD-10-CM

## 2024-04-06 DIAGNOSIS — Z79899 Other long term (current) drug therapy: Secondary | ICD-10-CM

## 2024-04-06 DIAGNOSIS — Z5901 Sheltered homelessness: Secondary | ICD-10-CM

## 2024-04-06 DIAGNOSIS — G9341 Metabolic encephalopathy: Secondary | ICD-10-CM | POA: Diagnosis present

## 2024-04-06 DIAGNOSIS — R6520 Severe sepsis without septic shock: Secondary | ICD-10-CM | POA: Diagnosis present

## 2024-04-06 DIAGNOSIS — R509 Fever, unspecified: Secondary | ICD-10-CM | POA: Diagnosis present

## 2024-04-06 DIAGNOSIS — Z21 Asymptomatic human immunodeficiency virus [HIV] infection status: Secondary | ICD-10-CM | POA: Diagnosis present

## 2024-04-06 DIAGNOSIS — R4182 Altered mental status, unspecified: Principal | ICD-10-CM

## 2024-04-06 DIAGNOSIS — F17210 Nicotine dependence, cigarettes, uncomplicated: Secondary | ICD-10-CM | POA: Diagnosis present

## 2024-04-06 LAB — I-STAT CHEM 8, ED
BUN: 29 mg/dL — ABNORMAL HIGH (ref 8–23)
Calcium, Ion: 1.11 mmol/L — ABNORMAL LOW (ref 1.15–1.40)
Chloride: 103 mmol/L (ref 98–111)
Creatinine, Ser: 1.6 mg/dL — ABNORMAL HIGH (ref 0.61–1.24)
Glucose, Bld: 99 mg/dL (ref 70–99)
HCT: 37 % — ABNORMAL LOW (ref 39.0–52.0)
Hemoglobin: 12.6 g/dL — ABNORMAL LOW (ref 13.0–17.0)
Potassium: 4.4 mmol/L (ref 3.5–5.1)
Sodium: 135 mmol/L (ref 135–145)
TCO2: 21 mmol/L — ABNORMAL LOW (ref 22–32)

## 2024-04-06 LAB — PROTIME-INR
INR: 1 (ref 0.8–1.2)
Prothrombin Time: 13.6 s (ref 11.4–15.2)

## 2024-04-06 LAB — COMPREHENSIVE METABOLIC PANEL WITH GFR
ALT: 40 U/L (ref 0–44)
AST: 58 U/L — ABNORMAL HIGH (ref 15–41)
Albumin: 4.3 g/dL (ref 3.5–5.0)
Alkaline Phosphatase: 102 U/L (ref 38–126)
Anion gap: 13 (ref 5–15)
BUN: 24 mg/dL — ABNORMAL HIGH (ref 8–23)
CO2: 20 mmol/L — ABNORMAL LOW (ref 22–32)
Calcium: 9.3 mg/dL (ref 8.9–10.3)
Chloride: 100 mmol/L (ref 98–111)
Creatinine, Ser: 1.63 mg/dL — ABNORMAL HIGH (ref 0.61–1.24)
GFR, Estimated: 46 mL/min — ABNORMAL LOW
Glucose, Bld: 100 mg/dL — ABNORMAL HIGH (ref 70–99)
Potassium: 4.7 mmol/L (ref 3.5–5.1)
Sodium: 133 mmol/L — ABNORMAL LOW (ref 135–145)
Total Bilirubin: 0.3 mg/dL (ref 0.0–1.2)
Total Protein: 7.8 g/dL (ref 6.5–8.1)

## 2024-04-06 LAB — CBC WITH DIFFERENTIAL/PLATELET
Abs Immature Granulocytes: 0.06 K/uL (ref 0.00–0.07)
Basophils Absolute: 0 K/uL (ref 0.0–0.1)
Basophils Relative: 1 %
Eosinophils Absolute: 0 K/uL (ref 0.0–0.5)
Eosinophils Relative: 1 %
HCT: 36.5 % — ABNORMAL LOW (ref 39.0–52.0)
Hemoglobin: 12.2 g/dL — ABNORMAL LOW (ref 13.0–17.0)
Immature Granulocytes: 1 %
Lymphocytes Relative: 10 %
Lymphs Abs: 0.5 K/uL — ABNORMAL LOW (ref 0.7–4.0)
MCH: 33.2 pg (ref 26.0–34.0)
MCHC: 33.4 g/dL (ref 30.0–36.0)
MCV: 99.2 fL (ref 80.0–100.0)
Monocytes Absolute: 0.7 K/uL (ref 0.1–1.0)
Monocytes Relative: 14 %
Neutro Abs: 3.7 K/uL (ref 1.7–7.7)
Neutrophils Relative %: 73 %
Platelets: 142 K/uL — ABNORMAL LOW (ref 150–400)
RBC: 3.68 MIL/uL — ABNORMAL LOW (ref 4.22–5.81)
RDW: 13.5 % (ref 11.5–15.5)
WBC: 5 K/uL (ref 4.0–10.5)
nRBC: 0 % (ref 0.0–0.2)

## 2024-04-06 LAB — ETHANOL: Alcohol, Ethyl (B): <15 mg/dL

## 2024-04-06 LAB — I-STAT CG4 LACTIC ACID, ED: Lactic Acid, Venous: 0.9 mmol/L (ref 0.5–1.9)

## 2024-04-06 MED ORDER — HALOPERIDOL LACTATE 5 MG/ML IJ SOLN
5.0000 mg | Freq: Once | INTRAMUSCULAR | Status: AC
  Administered 2024-04-06: 5 mg via INTRAVENOUS
  Filled 2024-04-06: qty 1

## 2024-04-06 MED ORDER — ACETAMINOPHEN 325 MG PO TABS
650.0000 mg | ORAL_TABLET | Freq: Once | ORAL | Status: AC
  Administered 2024-04-06: 650 mg via ORAL
  Filled 2024-04-06: qty 2

## 2024-04-06 NOTE — ED Triage Notes (Addendum)
 Pt here via [facility] from [facility] where he had a mechanical witnessed fall.  Pt states he has bone cancer and he is always unsteady and falls a lot.  C/o R hip pain where he fell and has bone cancer.  Pt refused to have  EMS take his vs.  However, upon arrival pt has temp of 103 with hr in 130's.  [facility] is to call Ms Kaye at [facility] when the pt is discharged and they will arrange for an [place].  [phone].

## 2024-04-06 NOTE — ED Notes (Signed)
 IVC paperwork complete and in orange zone, expires 04/13/24 @ 9:25pm, case # [id number]

## 2024-04-06 NOTE — ED Notes (Signed)
 IVC in process, waiting on findings and custody

## 2024-04-06 NOTE — ED Provider Triage Note (Signed)
 Emergency Medicine Provider Triage Evaluation Note  Jeffrey [name] , a 69 y.o. male  was evaluated in triage.  Pt complains of fever.  Review of Systems  Positive: Fever, tachycardia Negative: Vomiting  Physical Exam  BP (!) 179/97 (BP Location: Right Arm)   Pulse (!) 136   Temp (!) 103.2 F (39.6 C) (Oral)   Resp 18   Ht 5' 5 (1.651 m)   Wt 63.5 kg   SpO2 100%   BMI 23.30 kg/m  Gen:   Awake, no distress    Resp:  Normal effort   MSK:   Moves extremities without difficulty   Other:  Appears feeble, having difficulty walking  Medical Decision Making  Medically screening exam initiated at 9:29 PM.  Appropriate orders placed.  Jeffrey [name] was informed that the remainder of the evaluation will be completed by another provider, this initial triage assessment does not replace that evaluation, and the importance of remaining in the ED until their evaluation is complete.  Patient presents from [facility] with fever and tachycardia.  He is trying to leave.  He says he does not want any further evaluation.  However he is confused.  He is unable to answer orientation questions.  He said since 2001.  Per chart review, he does have a history of EtOH abuse.  Will order labs, IVC papers initiated given that patient appears to be unsafe to leave at this time.   Lenor Hollering, MD 04/06/24 2130

## 2024-04-06 NOTE — ED Provider Notes (Incomplete)
 " [place] EMERGENCY DEPARTMENT AT [facility] Provider Note   CSN: [id number] Arrival date & time: 04/06/24  2056     Patient presents with: Medical Clearance   Jeffrey [name] is a 69 y.o. male.  {Add pertinent medical, surgical, social history, OB history to YEP:67052} The history is provided by the patient and medical records.   69 y.o. M with hx of cirrhosis, CKD, chronic pain, Hep C, homelessness, presenting to the ED for AMS.  He was evaluated in triage process and found to be altered with a fever of 103F but also trying to the leave the hospital so was placed under IVC.  He apparently had a mechanical fall that was witnessed by shelter staff.  He reports he stumbled into the wall, struck his head and fell onto his right hip.  He states he has been coughing, some production of green mucous.  Denies known fevers.  Unsure of sick contacts at shelter.  Prior to Admission medications  Medication Sig Start Date End Date Taking? Authorizing Provider  Glecaprevir -Pibrentasvir  (MAVYRET ) 100-40 MG TABS Take 3 tablets by mouth daily with breakfast. 09/09/23   Waddell Alan PARAS, RPH-CPP  predniSONE  (DELTASONE ) 10 MG tablet Take 4 tablets (40 mg total) by mouth daily for 5 days then stop. 09/09/23   Brien Belvie FORBES, MD    Allergies: Patient has no known allergies.    Review of Systems  Unable to perform ROS: Mental status change    Updated Vital Signs BP (!) 176/99   Pulse (!) 120   Temp (!) 103.3 F (39.6 C)   Resp 17   Ht 5' 5 (1.651 m)   Wt 63.5 kg   SpO2 100%   BMI 23.30 kg/m   Physical Exam Vitals and nursing note reviewed.  Constitutional:      Appearance: He is well-developed.  HENT:     Head: Normocephalic and atraumatic.  Eyes:     Conjunctiva/sclera: Conjunctivae normal.     Pupils: Pupils are equal, round, and reactive to light.  Cardiovascular:     Rate and Rhythm: Normal rate and regular rhythm.     Heart sounds: Normal heart sounds.  Pulmonary:      Effort: Pulmonary effort is normal.     Breath sounds: Normal breath sounds.  Abdominal:     General: Bowel sounds are normal.     Palpations: Abdomen is soft.  Musculoskeletal:        General: Normal range of motion.     Cervical back: Normal range of motion.  Skin:    General: Skin is warm and dry.  Neurological:     Mental Status: He is alert and oriented to person, place, and time.     (all labs ordered are listed, but only abnormal results are displayed) Labs Reviewed  CBC WITH DIFFERENTIAL/PLATELET - Abnormal; Notable for the following components:      Result Value   RBC 3.68 (*)    Hemoglobin 12.2 (*)    HCT 36.5 (*)    Platelets 142 (*)    Lymphs Abs 0.5 (*)    All other components within normal limits  CULTURE, BLOOD (ROUTINE X 2)  CULTURE, BLOOD (ROUTINE X 2)  RESP PANEL BY RT-PCR (RSV, FLU A&B, COVID)  RVPGX2  PROTIME-INR  COMPREHENSIVE METABOLIC PANEL WITH GFR  URINALYSIS, W/ REFLEX TO CULTURE (INFECTION SUSPECTED)  ETHANOL  URINE DRUG SCREEN  I-STAT CG4 LACTIC ACID, ED  I-STAT CHEM 8, ED  I-STAT CG4  LACTIC ACID, ED    EKG: None  Radiology: DG Chest 1 View Result Date: 04/06/2024 EXAM: 1 VIEW(S) XRAY OF THE CHEST 04/06/2024 09:51:00 PM COMPARISON: 10/27/2023 CLINICAL HISTORY: Sepsis (HCC) FINDINGS: LUNGS AND PLEURA: No focal pulmonary opacity. No pleural effusion. No pneumothorax. HEART AND MEDIASTINUM: Atherosclerotic calcifications of the aortic arch. No acute abnormality of the cardiac and mediastinal silhouettes. BONES AND SOFT TISSUES: No acute osseous abnormality. IMPRESSION: 1. No acute cardiopulmonary abnormality. 2. Atherosclerotic calcifications of the aortic arch. Electronically signed by: Dorethia Molt MD 04/06/2024 10:10 PM EST RP Workstation: [id number]    {Document cardiac monitor, telemetry assessment procedure when appropriate:32947} Procedures   Medications Ordered in the ED  acetaminophen  (TYLENOL ) tablet 650 mg (has no administration in  time range)      {Click here for ABCD2, HEART and other calculators REFRESH Note before signing:1}                              Medical Decision Making Amount and/or Complexity of Data Reviewed Labs: ordered. Radiology: ordered.  Risk Prescription drug management.   ***  11:48 PM Patient once again refusing care.  He is yelling and cursing at staff. Attempted to redirect and made aware that he is under IVC multiple times.  Continues to interfere with medical care.  He is given haldol.  Final diagnoses:  None    ED Discharge Orders     None        "

## 2024-04-07 ENCOUNTER — Emergency Department (HOSPITAL_COMMUNITY)

## 2024-04-07 ENCOUNTER — Encounter (HOSPITAL_COMMUNITY): Payer: Self-pay | Admitting: Internal Medicine

## 2024-04-07 DIAGNOSIS — G9341 Metabolic encephalopathy: Secondary | ICD-10-CM | POA: Diagnosis present

## 2024-04-07 DIAGNOSIS — Z21 Asymptomatic human immunodeficiency virus [HIV] infection status: Secondary | ICD-10-CM | POA: Diagnosis present

## 2024-04-07 DIAGNOSIS — A419 Sepsis, unspecified organism: Secondary | ICD-10-CM | POA: Diagnosis not present

## 2024-04-07 DIAGNOSIS — J101 Influenza due to other identified influenza virus with other respiratory manifestations: Secondary | ICD-10-CM | POA: Diagnosis not present

## 2024-04-07 DIAGNOSIS — N179 Acute kidney failure, unspecified: Secondary | ICD-10-CM | POA: Diagnosis present

## 2024-04-07 DIAGNOSIS — J102 Influenza due to other identified influenza virus with gastrointestinal manifestations: Secondary | ICD-10-CM | POA: Diagnosis present

## 2024-04-07 DIAGNOSIS — Z79899 Other long term (current) drug therapy: Secondary | ICD-10-CM | POA: Diagnosis not present

## 2024-04-07 DIAGNOSIS — M25551 Pain in right hip: Secondary | ICD-10-CM | POA: Diagnosis present

## 2024-04-07 DIAGNOSIS — B182 Chronic viral hepatitis C: Secondary | ICD-10-CM

## 2024-04-07 DIAGNOSIS — F17210 Nicotine dependence, cigarettes, uncomplicated: Secondary | ICD-10-CM | POA: Diagnosis present

## 2024-04-07 DIAGNOSIS — R509 Fever, unspecified: Secondary | ICD-10-CM | POA: Diagnosis not present

## 2024-04-07 DIAGNOSIS — A4189 Other specified sepsis: Secondary | ICD-10-CM | POA: Diagnosis present

## 2024-04-07 DIAGNOSIS — Z87898 Personal history of other specified conditions: Secondary | ICD-10-CM | POA: Diagnosis not present

## 2024-04-07 DIAGNOSIS — W19XXXA Unspecified fall, initial encounter: Secondary | ICD-10-CM | POA: Diagnosis present

## 2024-04-07 DIAGNOSIS — R6520 Severe sepsis without septic shock: Secondary | ICD-10-CM | POA: Diagnosis present

## 2024-04-07 DIAGNOSIS — G8929 Other chronic pain: Secondary | ICD-10-CM | POA: Diagnosis present

## 2024-04-07 DIAGNOSIS — N1832 Chronic kidney disease, stage 3b: Secondary | ICD-10-CM

## 2024-04-07 DIAGNOSIS — Z5901 Sheltered homelessness: Secondary | ICD-10-CM | POA: Diagnosis not present

## 2024-04-07 LAB — CBC WITH DIFFERENTIAL/PLATELET
Abs Immature Granulocytes: 0.04 K/uL (ref 0.00–0.07)
Basophils Absolute: 0 K/uL (ref 0.0–0.1)
Basophils Relative: 1 %
Eosinophils Absolute: 0 K/uL (ref 0.0–0.5)
Eosinophils Relative: 1 %
HCT: 35 % — ABNORMAL LOW (ref 39.0–52.0)
Hemoglobin: 11.7 g/dL — ABNORMAL LOW (ref 13.0–17.0)
Immature Granulocytes: 1 %
Lymphocytes Relative: 21 %
Lymphs Abs: 0.8 K/uL (ref 0.7–4.0)
MCH: 33 pg (ref 26.0–34.0)
MCHC: 33.4 g/dL (ref 30.0–36.0)
MCV: 98.6 fL (ref 80.0–100.0)
Monocytes Absolute: 0.9 K/uL (ref 0.1–1.0)
Monocytes Relative: 23 %
Neutro Abs: 2 K/uL (ref 1.7–7.7)
Neutrophils Relative %: 53 %
Platelets: 113 K/uL — ABNORMAL LOW (ref 150–400)
RBC: 3.55 MIL/uL — ABNORMAL LOW (ref 4.22–5.81)
RDW: 13.4 % (ref 11.5–15.5)
WBC: 3.8 K/uL — ABNORMAL LOW (ref 4.0–10.5)
nRBC: 0 % (ref 0.0–0.2)

## 2024-04-07 LAB — URINE DRUG SCREEN
Amphetamines: NEGATIVE
Barbiturates: NEGATIVE
Benzodiazepines: NEGATIVE
Cocaine: NEGATIVE
Fentanyl: NEGATIVE
Methadone Scn, Ur: NEGATIVE
Opiates: NEGATIVE
Tetrahydrocannabinol: NEGATIVE

## 2024-04-07 LAB — URINALYSIS, W/ REFLEX TO CULTURE (INFECTION SUSPECTED)
Bacteria, UA: NONE SEEN
Bilirubin Urine: NEGATIVE
Glucose, UA: NEGATIVE mg/dL
Ketones, ur: 5 mg/dL — AB
Leukocytes,Ua: NEGATIVE
Nitrite: NEGATIVE
Protein, ur: NEGATIVE mg/dL
Specific Gravity, Urine: 1.013 (ref 1.005–1.030)
pH: 5 (ref 5.0–8.0)

## 2024-04-07 LAB — RESP PANEL BY RT-PCR (RSV, FLU A&B, COVID)  RVPGX2
Influenza A by PCR: POSITIVE — AB
Influenza B by PCR: NEGATIVE
Resp Syncytial Virus by PCR: NEGATIVE
SARS Coronavirus 2 by RT PCR: NEGATIVE

## 2024-04-07 MED ORDER — HEPARIN SODIUM (PORCINE) 5000 UNIT/ML IJ SOLN
5000.0000 [IU] | Freq: Two times a day (BID) | INTRAMUSCULAR | Status: DC
  Administered 2024-04-07: 5000 [IU] via SUBCUTANEOUS
  Filled 2024-04-07 (×11): qty 1

## 2024-04-07 MED ORDER — GLECAPREVIR-PIBRENTASVIR 100-40 MG PO TABS: 3.0000 | ORAL_TABLET | Freq: Every day | ORAL | Status: DC

## 2024-04-07 MED ORDER — OSELTAMIVIR PHOSPHATE 30 MG PO CAPS
30.0000 mg | ORAL_CAPSULE | Freq: Two times a day (BID) | ORAL | Status: AC
  Administered 2024-04-07 – 2024-04-11 (×10): 30 mg via ORAL
  Filled 2024-04-07 (×11): qty 1

## 2024-04-07 MED ORDER — MENTHOL 3 MG MT LOZG: 1.0000 | LOZENGE | OROMUCOSAL | Status: DC | PRN

## 2024-04-07 MED ORDER — ONDANSETRON HCL 4 MG/2ML IJ SOLN: 4.0000 mg | Freq: Four times a day (QID) | INTRAMUSCULAR | Status: DC | PRN

## 2024-04-07 MED ORDER — HALOPERIDOL LACTATE 5 MG/ML IJ SOLN
5.0000 mg | Freq: Once | INTRAMUSCULAR | Status: DC | PRN
  Filled 2024-04-07: qty 1

## 2024-04-07 MED ORDER — GUAIFENESIN ER 600 MG PO TB12
600.0000 mg | ORAL_TABLET | Freq: Two times a day (BID) | ORAL | Status: DC
  Administered 2024-04-07 – 2024-04-13 (×12): 600 mg via ORAL
  Filled 2024-04-07 (×12): qty 1

## 2024-04-07 MED ORDER — BENZONATATE 100 MG PO CAPS: 200.0000 mg | ORAL_CAPSULE | Freq: Three times a day (TID) | ORAL | Status: DC | PRN

## 2024-04-07 MED ORDER — SODIUM CHLORIDE 0.9 % IV SOLN: 1.0000 g | INTRAVENOUS | Status: DC

## 2024-04-07 MED ORDER — ACETAMINOPHEN 325 MG PO TABS
650.0000 mg | ORAL_TABLET | Freq: Four times a day (QID) | ORAL | Status: DC | PRN
  Administered 2024-04-07 – 2024-04-13 (×4): 650 mg via ORAL
  Filled 2024-04-07 (×4): qty 2

## 2024-04-07 MED ORDER — ACETAMINOPHEN 650 MG RE SUPP: 650.0000 mg | Freq: Four times a day (QID) | RECTAL | Status: DC | PRN

## 2024-04-07 MED ORDER — MELATONIN 3 MG PO TABS
3.0000 mg | ORAL_TABLET | Freq: Every evening | ORAL | Status: DC | PRN
  Administered 2024-04-09 – 2024-04-10 (×2): 3 mg via ORAL
  Filled 2024-04-07 (×2): qty 1

## 2024-04-07 NOTE — ED Notes (Signed)
 Patient transported to X-ray

## 2024-04-07 NOTE — H&P (Signed)
 " History and Physical      Jeffrey [name] FMW:[id number] DOB: [birth year] DOA: 04/06/2024; DOS: 04/07/2024  PCP: Brien Belvie FORBES, MD  Patient coming from: homeless shelter   I have personally briefly reviewed patient's old medical records in [facility]  Chief Complaint: Altered mental status  HPI: Jeffrey [name] is a 69 y.o. male with medical history significant for chronic hepatitis C, CKD 3B with baseline creatinine 1.5-1.9, who is admitted to [facility] on 04/06/2024 with acute metabolic encephalopathy in the setting of influenza A infection after presenting from homeless shelter to [facility] ED for evaluation of altered mental status.   The patient, who has been residing at homeless shelter, was sent over after staff at shelter noted the patient to be confused, altered, agitated relative to his baseline mental status.   Emesis, emergency department, the patient was noted to be disoriented, confused, unable to correctly identify the current year, stating his belief that is currently 2001.  Per staff at shelter, this is a departure from his baseline mental status.  The patient was reported to convey a recent productive cough, although it is unclear the duration of his cough.  No report of any chest pain.  Medical history notable for chronic otitis C for which he is on antiviral therapy, with history also notable for CKD 3B with a baseline cr 1.5-1.9      ED Course:  Vital signs in the ED were notable for the following: Temperature max 103.3; heart rates initially in the 120s to 130s, associated sinus tachycardia, subsequently decreasing into the 80s to 90s; systolic pressures in the 130s 170; respiratory rate 17-18, oxygen saturation 98 to 100% on room air.  Labs were notable for the following: CMP notable for the following: Creatinine 1.63 compared to most recent prior by 1.88 in August 2025, glucose 100.  CBC notable for white blood cell count 5000, hemoglobin 12.2.  Lactic acid  0.2.  INR 1.0.  Urinalysis showed no evidence of white blood cells, urinary drug screen is pan negative.  Serum ethanol level less than 15.  Blood cultures x 2 were collected in the ED, and influenza A PCR is found to be positive will influenza B, RSV, and COVID PCR were all found to be negative.  CT head showed no evidence of acute intracranial process, We will 1 view chest x-ray showed no evidence of acute cardiopulmonary process, Cleen evidence of infiltrate edema cavitation, or pneumothorax.  Patient agitated, confused in the ED, biking orientation, as above, attempting to leave independently, AMA.  He was subsequently IVC by EDP.   While in the ED, the following were administered: Acetaminophen  650 mg p.o. x 1 dose, Haldol 5 mg IV x 1 dose, Tamiflu 30 mg p.o.  Subsequently, the patient was admitted for further evaluation management of acute metabolic encephalopathy in the setting of severe sepsis due to influenza A infection.     Review of Systems: As per HPI otherwise 10 point review of systems negative.   Past Medical History:  Diagnosis Date   Cataract    Glaucoma    Hearing deficit    Hepatitis C    Renal disorder     History reviewed. No pertinent surgical history.  Social History:  reports that he has been smoking cigarettes. He has never used smokeless tobacco. He reports current alcohol use. He reports that he does not use drugs.   Allergies[1]  Family History  Problem Relation Age of Onset  Cancer Mother    Cancer Sister     Prior to Admission medications  Medication Sig Start Date End Date Taking? Authorizing Provider  Glecaprevir -Pibrentasvir  (MAVYRET ) 100-40 MG TABS Take 3 tablets by mouth daily with breakfast. 09/09/23   Waddell Alan PARAS, RPH-CPP  predniSONE  (DELTASONE ) 10 MG tablet Take 4 tablets (40 mg total) by mouth daily for 5 days then stop. 09/09/23   [name], MD     Objective    Physical Exam: Vitals:   04/06/24 2103 04/06/24 2106  04/06/24 2251 04/07/24 0211  BP: (!) 179/97  (!) 176/99 138/83  Pulse: (!) 136  (!) 120 98  Resp: 18  17 18   Temp: (!) 103.2 F (39.6 C)  (!) 103.3 F (39.6 C) 100.2 F (37.9 C)  TempSrc: Oral   Oral  SpO2: 100%  100% 98%  Weight:  63.5 kg    Height:  5' 5 (1.651 m)      General: appears to be stated age; somnolent Skin: warm, dry, no rash Head:  AT/[place] Mouth:  Oral mucosa membranes appear dry, normal dentition Neck: supple; trachea midline Heart:  RRR; did not appreciate any M/R/G Lungs: CTAB, did not appreciate any wheezes, rales, or rhonchi Abdomen: + BS; soft, ND Extremities: no peripheral edema, no muscle wasting        Labs on Admission: I have personally reviewed following labs and imaging studies  CBC: Recent Labs  Lab 04/06/24 2245 04/06/24 2338  WBC 5.0  --   NEUTROABS 3.7  --   HGB 12.2* 12.6*  HCT 36.5* 37.0*  MCV 99.2  --   PLT 142*  --    Basic Metabolic Panel: Recent Labs  Lab 04/06/24 2245 04/06/24 2338  NA 133* 135  K 4.7 4.4  CL 100 103  CO2 20*  --   GLUCOSE 100* 99  BUN 24* 29*  CREATININE 1.63* 1.60*  CALCIUM  9.3  --    GFR: Estimated Creatinine Clearance: 39 mL/min (A) (by C-G formula based on SCr of 1.6 mg/dL (H)). Liver Function Tests: Recent Labs  Lab 04/06/24 2245  AST 58*  ALT 40  ALKPHOS 102  BILITOT 0.3  PROT 7.8  ALBUMIN 4.3   No results for input(s): LIPASE, AMYLASE in the last 168 hours. No results for input(s): AMMONIA in the last 168 hours. Coagulation Profile: Recent Labs  Lab 04/06/24 2245  INR 1.0   Cardiac Enzymes: No results for input(s): CKTOTAL, CKMB, CKMBINDEX, TROPONINI in the last 168 hours. BNP (last 3 results) No results for input(s): PROBNP in the last 8760 hours. HbA1C: No results for input(s): HGBA1C in the last 72 hours. CBG: No results for input(s): GLUCAP in the last 168 hours. Lipid Profile: No results for input(s): CHOL, HDL, LDLCALC, TRIG, CHOLHDL,  LDLDIRECT in the last 72 hours. Thyroid Function Tests: No results for input(s): TSH, T4TOTAL, FREET4, T3FREE, THYROIDAB in the last 72 hours. Anemia Panel: No results for input(s): VITAMINB12, FOLATE, FERRITIN, TIBC, IRON, RETICCTPCT in the last 72 hours. Urine analysis:    Component Value Date/Time   COLORURINE YELLOW 04/06/2024 2128   APPEARANCEUR CLEAR 04/06/2024 2128   LABSPEC 1.013 04/06/2024 2128   PHURINE 5.0 04/06/2024 2128   GLUCOSEU NEGATIVE 04/06/2024 2128   HGBUR MODERATE (A) 04/06/2024 2128   BILIRUBINUR NEGATIVE 04/06/2024 2128   KETONESUR 5 (A) 04/06/2024 2128   PROTEINUR NEGATIVE 04/06/2024 2128   UROBILINOGEN 0.2 05/29/2010 2245   NITRITE NEGATIVE 04/06/2024 2128   LEUKOCYTESUR NEGATIVE 04/06/2024 2128  Radiological Exams on Admission: DG Hip Unilat W or Wo Pelvis 2-3 Views Right Result Date: 04/07/2024 EXAM: 2 or more VIEW(S) XRAY OF THE BILATERAL HIP 04/07/2024 12:51:00 AM COMPARISON: 12/16/2023 CLINICAL HISTORY: fall, hip pain FINDINGS: BONES AND JOINTS: No acute fracture. No malalignment. Mild degenerative changes of the hips. SOFT TISSUES: Vascular calcifications. LUMBAR SPINE: Degenerative changes of the lower lumbar spine. IMPRESSION: 1. No acute fracture or dislocation. 2. Mild degenerative changes of the hips. Electronically signed by: Franky Stanford MD 04/07/2024 12:59 AM EST RP Workstation: [id number]   CT Head Wo Contrast Result Date: 04/07/2024 EXAM: CT HEAD WITHOUT 04/07/2024 12:54:34 AM TECHNIQUE: CT of the head was performed without the administration of intravenous contrast. Automated exposure control, iterative reconstruction, and/or weight based adjustment of the mA/kV was utilized to reduce the radiation dose to as low as reasonably achievable. COMPARISON: None available. CLINICAL HISTORY: Head trauma, minor (Age >= 65y). FINDINGS: BRAIN AND VENTRICLES: No acute intracranial hemorrhage. No mass effect or midline shift. No  extra-axial fluid collection. No evidence of acute infarct. Unchanged severe ventriculomegaly with chronic ischemic white matter changes. There is an acute callosal angle. ORBITS: No acute abnormality. SINUSES AND MASTOIDS: No acute abnormality. SOFT TISSUES AND SKULL: No acute skull fracture. No acute soft tissue abnormality. IMPRESSION: 1. No acute intracranial abnormality. 2. Unchanged severe ventriculomegaly with chronic ischemic white matter changes. Electronically signed by: Franky Stanford MD 04/07/2024 12:59 AM EST RP Workstation: [id number]   DG Chest 1 View Result Date: 04/06/2024 EXAM: 1 VIEW(S) XRAY OF THE CHEST 04/06/2024 09:51:00 PM COMPARISON: 10/27/2023 CLINICAL HISTORY: Sepsis (HCC) FINDINGS: LUNGS AND PLEURA: No focal pulmonary opacity. No pleural effusion. No pneumothorax. HEART AND MEDIASTINUM: Atherosclerotic calcifications of the aortic arch. No acute abnormality of the cardiac and mediastinal silhouettes. BONES AND SOFT TISSUES: No acute osseous abnormality. IMPRESSION: 1. No acute cardiopulmonary abnormality. 2. Atherosclerotic calcifications of the aortic arch. Electronically signed by: Dorethia Molt MD 04/06/2024 10:10 PM EST RP Workstation: [id number]      Assessment/Plan   Principal Problem:   Acute metabolic encephalopathy Active Problems:   Chronic hepatitis C (HCC)   CKD stage 3b, GFR 30-44 ml/min (HCC)   Severe sepsis (HCC)   Influenza A   Fever        #) Acute metabolic encephalopathy: 1 day of confusion, altered mental status, agitation relative to baseline mental status per staff at homeless shelter,  which appears to be on the basis of physiologic stressors stemming from presenting severe sepsis due to influenza A infection, as further detailed below.  No obvious additional contributory underlying infectious process at this time, including presenting UA that is not suggestive of UTI, while chest x-ray showed no evidence of acute process, including no evidence  of infiltrate.  No additional overt metabolic/electrolyte contributions at this time.  No obvious contributory pharmacologic factors. No overt acute focal neurologic deficits to suggest a contribution from an underlying acute CVA. Seizures are also felt to be less likely.  As noted above, was IVCs in the ED by EDP.  Plan: fall precautions. Delirium precautions. Repeat CMP/CBC in the AM. Check Mg level. check TSH, B12 level.  Further evaluation management severe sepsis due to influenza A infection, as below.  Check CPK level.  Ammonia level pending.  Add on procalcitonin level.                      #) Severe sepsis due to influenza A infection: Diagnosed on the basis of 1 day  of confusion, altered mental status, cough, with objective fever, with presenting temperature max 103.3, with influenza A PCR found positive in the ED.   SIRS criteria met via objective fever, tachycardia. Lactic acid level: Nonelevated at 0.9. Of note, given the associated presence of suspected end organ damage in the form of concominant presenting altered mental status felt to be the basis of acute metabolic encephalopathy stemming from his underlying infection, criteria are met for pt's sepsis to be considered severe in nature. However, in the absence of lactic acid level that is greater than or equal to 4.0, and in the absence of any associated hypotension refractory to IVF's, there are no indications for administration of a 30 mL/kg IVF bolus at this time.   Additional ED work-up/management notable for: Blood cultures x 2 were collected in the ED.  However, in the absence of any evidence of underlying bacterial infection, there is no indication for initiation of IV biotics at this time.  He did demonstrate any evidence of nuchal rigidity to increase index suspicion for meningitis.   As the patient's altered mental status started earlier today with unclear duration of cough, the patient was started on renally  dosed Tamiflu in the ED, which will be continued for now.  Plan: CBC w/ diff and CMP in AM.  Follow for results of blood cx's x 2.  Refraining from antibiotics in the absence of any evidence of underlying bacterial infection.  Continue Tamiflu.  Scheduled guaifenesin.  Prn Tessalon Perles.  Incentive spirometry.  Add on procalcitonin level.                     #) Chronic hepatitis C:-History of such, on mavyret  as outpatient.   Plan: Resume home mavyret  .                    #) CKD Stage 3B: Documented history of such, with baseline creatinine 1.5-1.9, with presenting creatinine consistent with this baseline.   Plan: Monitor strict I's and O's and daily weights.  Attempt to avoid nephrotoxic agents.  CMP/magnesium level in the AM.  Of note, Tamiflu, initiated today, has been renally dosed, as further detailed above.            DVT prophylaxis: SCD's   Code Status: Full code Family Communication: none Disposition Plan: Per Rounding Team Consults called: none;  Admission status: inpatient     I SPENT GREATER THAN 75  MINUTES IN CLINICAL CARE TIME/MEDICAL DECISION-MAKING IN COMPLETING THIS ADMISSION.      Pranshu Lyster B Arion Shankles DO [facility]  From 7PM - 7AM   04/07/2024, 2:20 AM         [1] No Known Allergies  "

## 2024-04-07 NOTE — ED Notes (Signed)
Pt placed in paper scrubs.

## 2024-04-07 NOTE — ED Notes (Signed)
 Lab called to say that CMP sample hemolyzed.   Will work to get a new one.

## 2024-04-07 NOTE — Progress Notes (Signed)
 " [name]  FMW:[id number] DOB: July 11, 1955 DOA: 04/06/2024 PCP: Brien Belvie FORBES, MD  Outpatient Specialists:     Brief Narrative:  The patient is a 69 year old male with past medical history significant for chronic hepatitis C, CKD 3B with baseline creatinine 1.5-1.9.  Patient was admitted with acute metabolic encephalopathy in the setting of influenza A infection.  Patient is currently on Tamiflu 30 mg p.o. twice daily.    04/07/2024: Patient seen.  Patient is a poor historian.  Encephalopathy seems to be improving.   Assessment & Plan:   Principal Problem:   Acute metabolic encephalopathy Active Problems:   Chronic hepatitis C (HCC)   CKD stage 3b, GFR 30-44 ml/min (HCC)   Severe sepsis (HCC)   Influenza A   Fever  Acute metabolic encephalopathy: - Workup is in progress. - Likely secondary to influenza A. - Possibly multifactorial. - Encephalopathy is improving.  Sepsis/influenza A infection: - Supportive care. - Tamiflu. - Further management will depend on hospital course.  Chronic hepatitis C: - Continue Mavyret .  Chronic kidney disease stage IIIb: - Stable. - Continue to monitor closely.   DVT prophylaxis: Start heparin 5000 units twice daily. Code Status: Full code Family Communication:  Disposition Plan:    Consultants:  None.  Procedures:  None.  Antimicrobials:  None. (Patient is on Tamiflu for influenza A and Mavyret  for hep C)   Subjective: Poor historian  Objective: Vitals:   04/07/24 0555 04/07/24 1257 04/07/24 1417 04/07/24 1520  BP:  (!) 142/84  136/76  Pulse:  87  100  Resp:  16  (!) 21  Temp: (!) 100.9 F (38.3 C)  (!) 102.3 F (39.1 C)   TempSrc: Oral  Oral   SpO2:  98%  99%  Weight:      Height:        Intake/Output Summary (Last 24 hours) at 04/07/2024 1600 Last data filed at 04/07/2024 1105 Gross per 24 hour  Intake 118 ml  Output 520 ml  Net -402 ml   Filed Weights   04/06/24 2106  Weight:  63.5 kg    Examination:  General exam: Appears calm and comfortable  Respiratory system: Clear to auscultation. Respiratory effort normal. Cardiovascular system: S1 & S2 heard. Gastrointestinal system: Abdomen is soft and nontender.  Central nervous system: Awake and alert. Extremities: No leg edema Data Reviewed: I have personally reviewed following labs and imaging studies  CBC: Recent Labs  Lab 04/06/24 2245 04/06/24 2338 04/07/24 0422  WBC 5.0  --  3.8*  NEUTROABS 3.7  --  2.0  HGB 12.2* 12.6* 11.7*  HCT 36.5* 37.0* 35.0*  MCV 99.2  --  98.6  PLT 142*  --  113*   Basic Metabolic Panel: Recent Labs  Lab 04/06/24 2245 04/06/24 2338  NA 133* 135  K 4.7 4.4  CL 100 103  CO2 20*  --   GLUCOSE 100* 99  BUN 24* 29*  CREATININE 1.63* 1.60*  CALCIUM  9.3  --    GFR: Estimated Creatinine Clearance: 39 mL/min (A) (by C-G formula based on SCr of 1.6 mg/dL (H)). Liver Function Tests: Recent Labs  Lab 04/06/24 2245  AST 58*  ALT 40  ALKPHOS 102  BILITOT 0.3  PROT 7.8  ALBUMIN 4.3   No results for input(s): LIPASE, AMYLASE in the last 168 hours. No results for input(s): AMMONIA in the last 168 hours. Coagulation Profile: Recent Labs  Lab 04/06/24 2245  INR 1.0  Cardiac Enzymes: No results for input(s): CKTOTAL, CKMB, CKMBINDEX, TROPONINI in the last 168 hours. BNP (last 3 results) No results for input(s): PROBNP in the last 8760 hours. HbA1C: No results for input(s): HGBA1C in the last 72 hours. CBG: No results for input(s): GLUCAP in the last 168 hours. Lipid Profile: No results for input(s): CHOL, HDL, LDLCALC, TRIG, CHOLHDL, LDLDIRECT in the last 72 hours. Thyroid Function Tests: No results for input(s): TSH, T4TOTAL, FREET4, T3FREE, THYROIDAB in the last 72 hours. Anemia Panel: No results for input(s): VITAMINB12, FOLATE, FERRITIN, TIBC, IRON, RETICCTPCT in the last 72 hours. Urine analysis:     Component Value Date/Time   COLORURINE YELLOW 04/06/2024 2128   APPEARANCEUR CLEAR 04/06/2024 2128   LABSPEC 1.013 04/06/2024 2128   PHURINE 5.0 04/06/2024 2128   GLUCOSEU NEGATIVE 04/06/2024 2128   HGBUR MODERATE (A) 04/06/2024 2128   BILIRUBINUR NEGATIVE 04/06/2024 2128   KETONESUR 5 (A) 04/06/2024 2128   PROTEINUR NEGATIVE 04/06/2024 2128   UROBILINOGEN 0.2 05/29/2010 2245   NITRITE NEGATIVE 04/06/2024 2128   LEUKOCYTESUR NEGATIVE 04/06/2024 2128   Sepsis Labs: @LABRCNTIP (procalcitonin:4,lacticidven:4)  ) Recent Results (from the past 240 hours)  Blood Culture (routine x 2)     Status: None (Preliminary result)   Collection Time: 04/06/24  9:28 PM   Specimen: BLOOD LEFT FOREARM  Result Value Ref Range Status   Specimen Description BLOOD LEFT FOREARM  Final   Special Requests   Final    BOTTLES DRAWN AEROBIC AND ANAEROBIC Blood Culture results may not be optimal due to an inadequate volume of blood received in culture bottles   Culture   Final    NO GROWTH < 12 HOURS Performed at [facility], [address], [place], [place] [address]    Report Status PENDING  Incomplete  Resp panel by RT-PCR (RSV, Flu A&B, Covid) Anterior Nasal Swab     Status: Abnormal   Collection Time: 04/06/24  9:28 PM   Specimen: Anterior Nasal Swab  Result Value Ref Range Status   SARS Coronavirus 2 by RT PCR NEGATIVE NEGATIVE Final   Influenza A by PCR POSITIVE (A) NEGATIVE Final   Influenza B by PCR NEGATIVE NEGATIVE Final    Comment: (NOTE) The Xpert Xpress SARS-CoV-2/FLU/RSV plus assay is intended as an aid in the diagnosis of influenza from Nasopharyngeal swab specimens and should not be used as a sole basis for treatment. Nasal washings and aspirates are unacceptable for Xpert Xpress SARS-CoV-2/FLU/RSV testing.  Fact Sheet for Patients: bloggercourse.com  Fact Sheet for Healthcare Providers: seriousbroker.it  This test is not  yet approved or cleared by the [place]  FDA and has been authorized for detection and/or diagnosis of SARS-CoV-2 by FDA under an Emergency Use Authorization (EUA). This EUA will remain in effect (meaning this test can be used) for the duration of the COVID-19 declaration under Section 564(b)(1) of the Act, 21 U.S.C. section 360bbb-3(b)(1), unless the authorization is terminated or revoked.     Resp Syncytial Virus by PCR NEGATIVE NEGATIVE Final    Comment: (NOTE) Fact Sheet for Patients: bloggercourse.com  Fact Sheet for Healthcare Providers: seriousbroker.it  This test is not yet approved or cleared by the [place]  FDA and has been authorized for detection and/or diagnosis of SARS-CoV-2 by FDA under an Emergency Use Authorization (EUA). This EUA will remain in effect (meaning this test can be used) for the duration of the COVID-19 declaration under Section 564(b)(1) of the Act, 21 U.S.C. section 360bbb-3(b)(1), unless the authorization  is terminated or revoked.  Performed at [facility], [address] [place], [place] [address]          Radiology Studies: DG Hip Unilat W or Wo Pelvis 2-3 Views Right Result Date: 04/07/2024 EXAM: 2 or more VIEW(S) XRAY OF THE BILATERAL HIP 04/07/2024 12:51:00 AM COMPARISON: 12/16/2023 CLINICAL HISTORY: fall, hip pain FINDINGS: BONES AND JOINTS: No acute fracture. No malalignment. Mild degenerative changes of the hips. SOFT TISSUES: Vascular calcifications. LUMBAR SPINE: Degenerative changes of the lower lumbar spine. IMPRESSION: 1. No acute fracture or dislocation. 2. Mild degenerative changes of the hips. Electronically signed by: Franky Stanford MD 04/07/2024 12:59 AM EST RP Workstation: [id number]   CT Head Wo Contrast Result Date: 04/07/2024 EXAM: CT HEAD WITHOUT 04/07/2024 12:54:34 AM TECHNIQUE: CT of the head was performed without the administration of intravenous contrast.  Automated exposure control, iterative reconstruction, and/or weight based adjustment of the mA/kV was utilized to reduce the radiation dose to as low as reasonably achievable. COMPARISON: None available. CLINICAL HISTORY: Head trauma, minor (Age >= 65y). FINDINGS: BRAIN AND VENTRICLES: No acute intracranial hemorrhage. No mass effect or midline shift. No extra-axial fluid collection. No evidence of acute infarct. Unchanged severe ventriculomegaly with chronic ischemic white matter changes. There is an acute callosal angle. ORBITS: No acute abnormality. SINUSES AND MASTOIDS: No acute abnormality. SOFT TISSUES AND SKULL: No acute skull fracture. No acute soft tissue abnormality. IMPRESSION: 1. No acute intracranial abnormality. 2. Unchanged severe ventriculomegaly with chronic ischemic white matter changes. Electronically signed by: Franky Stanford MD 04/07/2024 12:59 AM EST RP Workstation: [id number]   DG Chest 1 View Result Date: 04/06/2024 EXAM: 1 VIEW(S) XRAY OF THE CHEST 04/06/2024 09:51:00 PM COMPARISON: 10/27/2023 CLINICAL HISTORY: Sepsis (HCC) FINDINGS: LUNGS AND PLEURA: No focal pulmonary opacity. No pleural effusion. No pneumothorax. HEART AND MEDIASTINUM: Atherosclerotic calcifications of the aortic arch. No acute abnormality of the cardiac and mediastinal silhouettes. BONES AND SOFT TISSUES: No acute osseous abnormality. IMPRESSION: 1. No acute cardiopulmonary abnormality. 2. Atherosclerotic calcifications of the aortic arch. Electronically signed by: Dorethia Molt MD 04/06/2024 10:10 PM EST RP Workstation: [id number]        Scheduled Meds:  Glecaprevir -Pibrentasvir   3 tablet Oral Q breakfast   guaiFENesin  600 mg Oral BID   oseltamivir  30 mg Oral BID   Continuous Infusions:   LOS: 0 days    Time spent: 55 minutes    Leatrice Chapel, MD  [facility] Pager #: [phone] 7PM-7AM contact night coverage as above    "

## 2024-04-08 ENCOUNTER — Other Ambulatory Visit (HOSPITAL_COMMUNITY): Payer: Self-pay

## 2024-04-08 ENCOUNTER — Inpatient Hospital Stay (HOSPITAL_COMMUNITY)

## 2024-04-08 DIAGNOSIS — Z87898 Personal history of other specified conditions: Secondary | ICD-10-CM | POA: Diagnosis not present

## 2024-04-08 DIAGNOSIS — G9341 Metabolic encephalopathy: Secondary | ICD-10-CM | POA: Diagnosis not present

## 2024-04-08 LAB — COMPREHENSIVE METABOLIC PANEL WITH GFR
ALT: 30 U/L (ref 0–44)
AST: 50 U/L — ABNORMAL HIGH (ref 15–41)
Albumin: 3.6 g/dL (ref 3.5–5.0)
Alkaline Phosphatase: 87 U/L (ref 38–126)
Anion gap: 12 (ref 5–15)
BUN: 29 mg/dL — ABNORMAL HIGH (ref 8–23)
CO2: 20 mmol/L — ABNORMAL LOW (ref 22–32)
Calcium: 8.4 mg/dL — ABNORMAL LOW (ref 8.9–10.3)
Chloride: 103 mmol/L (ref 98–111)
Creatinine, Ser: 1.59 mg/dL — ABNORMAL HIGH (ref 0.61–1.24)
GFR, Estimated: 47 mL/min — ABNORMAL LOW
Glucose, Bld: 105 mg/dL — ABNORMAL HIGH (ref 70–99)
Potassium: 3.7 mmol/L (ref 3.5–5.1)
Sodium: 136 mmol/L (ref 135–145)
Total Bilirubin: 0.2 mg/dL (ref 0.0–1.2)
Total Protein: 6.7 g/dL (ref 6.5–8.1)

## 2024-04-08 LAB — CBC WITH DIFFERENTIAL/PLATELET
Abs Immature Granulocytes: 0.04 K/uL (ref 0.00–0.07)
Basophils Absolute: 0 K/uL (ref 0.0–0.1)
Basophils Relative: 1 %
Eosinophils Absolute: 0.1 K/uL (ref 0.0–0.5)
Eosinophils Relative: 3 %
HCT: 35.5 % — ABNORMAL LOW (ref 39.0–52.0)
Hemoglobin: 12 g/dL — ABNORMAL LOW (ref 13.0–17.0)
Immature Granulocytes: 1 %
Lymphocytes Relative: 53 %
Lymphs Abs: 1.6 K/uL (ref 0.7–4.0)
MCH: 32.8 pg (ref 26.0–34.0)
MCHC: 33.8 g/dL (ref 30.0–36.0)
MCV: 97 fL (ref 80.0–100.0)
Monocytes Absolute: 0.4 K/uL (ref 0.1–1.0)
Monocytes Relative: 14 %
Neutro Abs: 0.9 K/uL — ABNORMAL LOW (ref 1.7–7.7)
Neutrophils Relative %: 28 %
Platelets: 130 K/uL — ABNORMAL LOW (ref 150–400)
RBC: 3.66 MIL/uL — ABNORMAL LOW (ref 4.22–5.81)
RDW: 13.2 % (ref 11.5–15.5)
WBC: 3 K/uL — ABNORMAL LOW (ref 4.0–10.5)
nRBC: 0 % (ref 0.0–0.2)

## 2024-04-08 LAB — AMMONIA: Ammonia: 33 umol/L (ref 9–35)

## 2024-04-08 LAB — MAGNESIUM: Magnesium: 2.2 mg/dL (ref 1.7–2.4)

## 2024-04-08 MED ORDER — QUETIAPINE FUMARATE 25 MG PO TABS
25.0000 mg | ORAL_TABLET | Freq: Two times a day (BID) | ORAL | Status: DC
  Administered 2024-04-08 (×2): 25 mg via ORAL
  Filled 2024-04-08 (×2): qty 1

## 2024-04-08 MED ORDER — OSELTAMIVIR PHOSPHATE 30 MG PO CAPS
30.0000 mg | ORAL_CAPSULE | Freq: Two times a day (BID) | ORAL | 0 refills | Status: DC
  Filled 2024-04-08: qty 8, 4d supply, fill #0

## 2024-04-08 MED ORDER — ALBUTEROL SULFATE HFA 108 (90 BASE) MCG/ACT IN AERS
2.0000 | INHALATION_SPRAY | Freq: Four times a day (QID) | RESPIRATORY_TRACT | 2 refills | Status: AC | PRN
  Filled 2024-04-08 – 2024-04-14 (×2): qty 6.7, 20d supply, fill #0

## 2024-04-08 MED ORDER — GUAIFENESIN-DM 100-10 MG/5ML PO SYRP
10.0000 mL | ORAL_SOLUTION | Freq: Three times a day (TID) | ORAL | 0 refills | Status: AC | PRN
  Filled 2024-04-08 – 2024-04-14 (×2): qty 118, 4d supply, fill #0

## 2024-04-08 MED ORDER — METOPROLOL TARTRATE 5 MG/5ML IV SOLN: 2.5000 mg | INTRAVENOUS | Status: DC | PRN

## 2024-04-08 NOTE — Discharge Summary (Signed)
 "                                                                                                                                                                               Discharge summary note.  Jeffrey [name] FMW:[id number] DOB: 10/14/1955 DOA: 04/06/2024  PCP: Brien Belvie FORBES, MD  Admit date: 04/06/2024  Discharge date: 04/08/2024  Admitted From: Home   Disposition:  Home   Recommendations for Outpatient Follow-up:   Follow up with PCP in 1-2 weeks  PCP Please obtain BMP/CBC, 2 view CXR in 1week,  (see Discharge instructions)   PCP Please follow up on the following pending results:    Home Health: None   Equipment/Devices: None  Consultations: Psych Discharge Condition: Stable    CODE STATUS: Full    Diet Recommendation: Heart Healthy     Chief Complaint  Patient presents with   Medical Clearance     Brief history of present illness from the day of admission and additional interim summary    69 y.o. male with medical history significant for chronic hepatitis C, CKD 3B with baseline creatinine 1.5-1.9, who is admitted to [facility] on 04/06/2024 with acute metabolic encephalopathy in the setting of influenza A infection after presenting from homeless shelter to [facility] ED for evaluation of altered mental status.    The patient, who has been residing at homeless shelter, was sent over after staff at shelter noted the patient to be confused, altered, agitated relative to his baseline mental status.  In the ER he was diagnosed with influenza A infection, metabolic encephalopathy, placed under IVC by the ER physician and admitted to the hospital for influenza A infection treatment and encephalopathy management.                                                                 Hospital Course   Acute metabolic encephalopathy.  in a patient who lives in a homeless shelter due to influenza A infection and ensuing inflammation, CT head -ve, no headache, no focal deficits,  mentation close to baseline except that he remains slightly aggressive in his demeanor towards the staff, refused blood work, seen by psych, AAO x 3, IVC cleared by Psych,will discharge him with follow-up with PCP.  He is not suicidal or homicidal.  Influenza A infection.  5 days of Tamiflu , currently symptom-free no cough or shortness of breath.  Counseled to be compliant with medication.  Sepsis at the time  of admission has resolved.  History of HIV, history of chronic hepatitis C.  Continue home medications. ? compliance,   Follow-up with PCP & ID in the outpatient setting.  CKD stage IIIb.  Baseline creatinine close to 1.6.  At baseline.  Refused labs this morning as he does not want to be poked or disturbed.    Discharge diagnosis     Principal Problem:   Acute metabolic encephalopathy Active Problems:   Chronic hepatitis C (HCC)   CKD stage 3b, GFR 30-44 ml/min (HCC)   Severe sepsis (HCC)   Influenza A   Fever    Discharge instructions    Discharge Instructions     Discharge instructions   Complete by: As directed    Follow with Primary MD [name], MD in 7 days   Get CBC, CMP, Magnesium, 2 view Chest X ray -  checked next visit with your primary MD   Activity: As tolerated with Full fall precautions use walker/cane & assistance as needed  Disposition Home    Diet: Heart Healthy    Special Instructions: If you have smoked or chewed Tobacco  in the last 2 yrs please stop smoking, stop any regular Alcohol  and or any Recreational drug use.  On your next visit with your primary care physician please Get Medicines reviewed and adjusted.  Please request your Prim.MD to go over all Hospital Tests and Procedure/Radiological results at the follow up, please get all Hospital records sent to your Prim MD by signing hospital release before you go home.  If you experience worsening of your admission symptoms, develop shortness of breath, life threatening emergency,  suicidal or homicidal thoughts you must seek medical attention immediately by calling 911 or calling your MD immediately  if symptoms less severe.  You Must read complete instructions/literature along with all the possible adverse reactions/side effects for all the Medicines you take and that have been prescribed to you. Take any new Medicines after you have completely understood and accpet all the possible adverse reactions/side effects.   Do not drive when taking Pain medications.  Do not take more than prescribed Pain, Sleep and Anxiety Medications  Wear Seat belts while driving.   Increase activity slowly   Complete by: As directed        Discharge Medications   Allergies as of 04/08/2024   No Known Allergies      Medication List     STOP taking these medications    predniSONE  10 MG tablet Commonly known as: DELTASONE        TAKE these medications    albuterol  108 (90 Base) MCG/ACT inhaler Commonly known as: VENTOLIN  HFA Inhale 2 puffs into the lungs every 6 (six) hours as needed for wheezing or shortness of breath.   guaiFENesin -dextromethorphan  100-10 MG/5ML syrup Commonly known as: ROBITUSSIN DM Take 10 mLs by mouth every 8 (eight) hours as needed for cough.   Mavyret  100-40 MG Tabs Generic drug: Glecaprevir -Pibrentasvir  Take 3 tablets by mouth daily with breakfast.   oseltamivir  30 MG capsule Commonly known as: TAMIFLU  Take 1 capsule (30 mg total) by mouth 2 (two) times daily.         Follow-up Information     [name], MD. Schedule an appointment as soon as possible for a visit in 1 week(s).   Specialty: Pulmonary Disease Contact information: [address] [place] [address] [phone]         Luiz Channel, MD. Schedule an  appointment as soon as possible for a visit in 1 week(s).   Specialty: Infectious Diseases Why: HIV Contact information: [address] [place] [address] [phone]                  Major procedures and Radiology Reports - PLEASE review detailed and final reports thoroughly  -       DG Hip Unilat W or Wo Pelvis 2-3 Views Right Result Date: 04/07/2024 EXAM: 2 or more VIEW(S) XRAY OF THE BILATERAL HIP 04/07/2024 12:51:00 AM COMPARISON: 12/16/2023 CLINICAL HISTORY: fall, hip pain FINDINGS: BONES AND JOINTS: No acute fracture. No malalignment. Mild degenerative changes of the hips. SOFT TISSUES: Vascular calcifications. LUMBAR SPINE: Degenerative changes of the lower lumbar spine. IMPRESSION: 1. No acute fracture or dislocation. 2. Mild degenerative changes of the hips. Electronically signed by: Franky Stanford MD 04/07/2024 12:59 AM EST RP Workstation: [id number]   CT Head Wo Contrast Result Date: 04/07/2024 EXAM: CT HEAD WITHOUT 04/07/2024 12:54:34 AM TECHNIQUE: CT of the head was performed without the administration of intravenous contrast. Automated exposure control, iterative reconstruction, and/or weight based adjustment of the mA/kV was utilized to reduce the radiation dose to as low as reasonably achievable. COMPARISON: None available. CLINICAL HISTORY: Head trauma, minor (Age >= 65y). FINDINGS: BRAIN AND VENTRICLES: No acute intracranial hemorrhage. No mass effect or midline shift. No extra-axial fluid collection. No evidence of acute infarct. Unchanged severe ventriculomegaly with chronic ischemic white matter changes. There is an acute callosal angle. ORBITS: No acute abnormality. SINUSES AND MASTOIDS: No acute abnormality. SOFT TISSUES AND SKULL: No acute skull fracture. No acute soft tissue abnormality. IMPRESSION: 1. No acute intracranial abnormality. 2. Unchanged severe ventriculomegaly with chronic ischemic white matter changes. Electronically signed by: Franky Stanford MD 04/07/2024 12:59 AM EST RP Workstation: [id number]   DG Chest 1 View Result Date: 04/06/2024 EXAM: 1 VIEW(S) XRAY OF THE CHEST 04/06/2024 09:51:00 PM COMPARISON: 10/27/2023 CLINICAL  HISTORY: Sepsis (HCC) FINDINGS: LUNGS AND PLEURA: No focal pulmonary opacity. No pleural effusion. No pneumothorax. HEART AND MEDIASTINUM: Atherosclerotic calcifications of the aortic arch. No acute abnormality of the cardiac and mediastinal silhouettes. BONES AND SOFT TISSUES: No acute osseous abnormality. IMPRESSION: 1. No acute cardiopulmonary abnormality. 2. Atherosclerotic calcifications of the aortic arch. Electronically signed by: Dorethia Molt MD 04/06/2024 10:10 PM EST RP Workstation: [id number]    Micro Results    Recent Results (from the past 240 hours)  Blood Culture (routine x 2)     Status: None (Preliminary result)   Collection Time: 04/06/24  9:28 PM   Specimen: BLOOD LEFT FOREARM  Result Value Ref Range Status   Specimen Description BLOOD LEFT FOREARM  Final   Special Requests   Final    BOTTLES DRAWN AEROBIC AND ANAEROBIC Blood Culture results may not be optimal due to an inadequate volume of blood received in culture bottles   Culture   Final    NO GROWTH 2 DAYS Performed at [facility], [address] [place], [place] [address]    Report Status PENDING  Incomplete  Resp panel by RT-PCR (RSV, Flu A&B, Covid) Anterior Nasal Swab     Status: Abnormal   Collection Time: 04/06/24  9:28 PM   Specimen: Anterior Nasal Swab  Result Value Ref Range Status   SARS Coronavirus 2 by RT PCR NEGATIVE NEGATIVE Final   Influenza A by PCR POSITIVE (A) NEGATIVE Final   Influenza B by PCR NEGATIVE NEGATIVE Final    Comment: (NOTE)  The Xpert Xpress SARS-CoV-2/FLU/RSV plus assay is intended as an aid in the diagnosis of influenza from Nasopharyngeal swab specimens and should not be used as a sole basis for treatment. Nasal washings and aspirates are unacceptable for Xpert Xpress SARS-CoV-2/FLU/RSV testing.  Fact Sheet for Patients: bloggercourse.com  Fact Sheet for Healthcare Providers: seriousbroker.it  This test is not yet  approved or cleared by the [place]  FDA and has been authorized for detection and/or diagnosis of SARS-CoV-2 by FDA under an Emergency Use Authorization (EUA). This EUA will remain in effect (meaning this test can be used) for the duration of the COVID-19 declaration under Section 564(b)(1) of the Act, 21 U.S.C. section 360bbb-3(b)(1), unless the authorization is terminated or revoked.     Resp Syncytial Virus by PCR NEGATIVE NEGATIVE Final    Comment: (NOTE) Fact Sheet for Patients: bloggercourse.com  Fact Sheet for Healthcare Providers: seriousbroker.it  This test is not yet approved or cleared by the [place]  FDA and has been authorized for detection and/or diagnosis of SARS-CoV-2 by FDA under an Emergency Use Authorization (EUA). This EUA will remain in effect (meaning this test can be used) for the duration of the COVID-19 declaration under Section 564(b)(1) of the Act, 21 U.S.C. section 360bbb-3(b)(1), unless the authorization is terminated or revoked.  Performed at [facility], [address] [place], [place] [address]     Today   Subjective    Jeffrey [name] today has no headache,no chest abdominal pain,no new weakness tingling or numbness, feels much better wants to go home today.     Objective   Blood pressure 115/77, pulse 89, temperature 98.6 F (37 C), temperature source Oral, resp. rate 20, height 5' 5 (1.651 m), weight 63.2 kg, SpO2 98%.   Intake/Output Summary (Last 24 hours) at 04/08/2024 1129 Last data filed at 04/08/2024 0400 Gross per 24 hour  Intake --  Output 200 ml  Net -200 ml    Exam  Awake Alert, No new F.N deficits,    [place].AT,PERRAL Supple Neck,   Symmetrical Chest wall movement, Good air movement bilaterally, CTAB RRR,No Gallops,   +ve B.Sounds, Abd Soft, Non tender,  No Cyanosis, Clubbing or edema    Data Review   Recent Labs  Lab 04/06/24 2245 04/06/24 2338  04/07/24 0422  WBC 5.0  --  3.8*  HGB 12.2* 12.6* 11.7*  HCT 36.5* 37.0* 35.0*  PLT 142*  --  113*  MCV 99.2  --  98.6  MCH 33.2  --  33.0  MCHC 33.4  --  33.4  RDW 13.5  --  13.4  LYMPHSABS 0.5*  --  0.8  MONOABS 0.7  --  0.9  EOSABS 0.0  --  0.0  BASOSABS 0.0  --  0.0    Recent Labs  Lab 04/06/24 2245 04/06/24 2338  NA 133* 135  K 4.7 4.4  CL 100 103  CO2 20*  --   ANIONGAP 13  --   GLUCOSE 100* 99  BUN 24* 29*  CREATININE 1.63* 1.60*  AST 58*  --   ALT 40  --   ALKPHOS 102  --   BILITOT 0.3  --   ALBUMIN 4.3  --   LATICACIDVEN  --  0.9  INR 1.0  --   CALCIUM  9.3  --     Total Time in preparing paper work, data evaluation and todays exam - 35 minutes  Signature  -    Lavada Stank M.D on 04/08/2024 at 11:29  AM   -  To page go to [email or web address]      "

## 2024-04-08 NOTE — Progress Notes (Signed)
 PT Cancellation Note  Patient Details Name: Jeffrey [name] MRN: [id number] DOB: 1955/06/07   Cancelled Treatment:     Pt being disrespectful towards staff. Pt upset that he doesn't have his clothes and declining session stating if you find my clothes, then I'll work with you. Discussed with RN, pt's clothes not on the floor or in the ED. Pt with multiple recent falls. Will follow up next date as able to evaluate.   Sabra Morel, PT, DPT  Acute Rehabilitation Services         Office: [phone]      Sabra MARLA Morel 04/08/2024, 3:17 PM

## 2024-04-08 NOTE — Consult Note (Signed)
 [facility] Psychiatric Consult Initial  Patient Name: .Jeffrey [name]  MRN: [id number]  DOB: May 26, 1955  Consult Order details:  Orders (From admission, onward)     Start     Ordered   04/08/24 0743  IP CONSULT TO PSYCHIATRY       Ordering Provider: Dennise Lavada POUR, MD  Provider:  (Not yet assigned)  Question Answer Comment  Location [facility]   Reason for Consult? Aggressive behaviour, placed under IVC in the ER 2 days ago, now wants to leaved the hospital - ? any danger to self or rothers, is it OK to DC IVC. Encephalopathy is much better but still aggressive      04/08/24 0743             Mode of Visit: In person    Psychiatry Consult Evaluation  Service Date: April 08, 2024 LOS:  LOS: 1 day  Chief Complaint 69 year old male who is on IVC demanding to leave.  Consult was to assess the need to continue the IVC.  Primary Psychiatric Diagnoses  Recent history of delirium. 2.  No past psych history on record 3.  Denies depression and substance use disorder.  Assessment  Jeffrey [name] is a 69 y.o. male admitted: Medicallyfor 04/06/2024  8:56 PM for altered mental status. He carries the psychiatric diagnoses of delirium and has a past medical history of as documented.   The patient was IVC in the ED, apparently he had a history of right hip pain and claims that he had bone cancer.  He was seen with a high fever and delirium in the ED and was IVC initially because he was demanding to leave.  However, he has no prior psychiatric history.  He has no drugs in his UDS.  When he was seen today around 10 AM, he was extremely irritable and was upset because he could not find his roller walker which he claims that he brought to the hospital.  His primary concern was that he uses that for ambulation.  He claims that he is on disability and he stays with [employer]. When seen today he was alert, oriented to time place and person.  His speech was rapid loud mildly  pressured and irritable because he was being detained in the hospital.  He denies any history of alcohol or substance use disorder and denies any SI/HI/AVH.  Currently he does not have any imminent risk to self or others other than his extreme irritability and anger.  We recommend releasing him from the IVC.  Discharge is at the discretion of the attending if he is medically stable. TOC consult recommended for outpatient follow-up.  Diagnoses:  Active Hospital problems: Principal Problem:   Acute metabolic encephalopathy Active Problems:   Chronic hepatitis C (HCC)   CKD stage 3b, GFR 30-44 ml/min (HCC)   Severe sepsis (HCC)   Influenza A   Fever    Plan   ## Psychiatric Medication Recommendations:  None  ## Medical Decision Making Capacity: Not specifically addressed in this encounter  ## Further Work-up:  -- As per the hospitalist EKG - -- Pertinent labwork reviewed earlier this admission includes: As per the hospitalist   ## Disposition:-- There are no psychiatric contraindications to discharge at this time  ## Behavioral / Environmental: -Difficult Patient (SELECT OPTIONS FROM BELOW) or Utilize compassion and acknowledge the patient's experiences while setting clear and realistic expectations for care.    ## Safety and Observation Level:  - Based  on my clinical evaluation, I estimate the patient to be at Low risk of self harm in the current setting. - At this time, we recommend  routine. This decision is based on my review of the chart including patient's history and current presentation, interview of the patient, mental status examination, and consideration of suicide risk including evaluating suicidal ideation, plan, intent, suicidal or self-harm behaviors, risk factors, and protective factors. This judgment is based on our ability to directly address suicide risk, implement suicide prevention strategies, and develop a safety plan while the patient is in the clinical  setting. Please contact our team if there is a concern that risk level has changed.  CSSR Risk Category:   Suicide Risk Assessment: Patient has following modifiable risk factors for suicide: None which we are addressing by outpatient appointments and medical care. Patient has following non-modifiable or demographic risk factors for suicide: male gender Patient has the following protective factors against suicide: Cultural, spiritual, or religious beliefs that discourage suicide and no history of suicide attempts  Thank you for this consult request. Recommendations have been communicated to the primary team.  We will sign off at this time.   Jeffrey BEETS, MD       History of Present Illness  Relevant Aspects of [facility] Course:  Admitted on 04/06/2024 for altered mental status.   Patient Report:  The patient is a 69 year old male with no prior psychiatric history and no prior substance use history was admitted through the ED with a delirium and extreme irritability.  Apparently lives at the [facility].  At the shelter he was noted to be confused with altered mental status and because he was irritable and labile and was refusing care.  Once he was admitted the patient was apparently stabilized and per the hospitalist patient was stable with no delirium.  When the patient was assessed he was very lucid and alert, oriented and irritable but knew he was in the hospital and denied any prior history of depression anxiety or psychosis and denied any active SI/HI/AVH. His main anger was because he is missing his walker but he denies any other psychiatric symptoms. Given the lack of criteria for IVC, he is being released from the IVC.  Psych ROS:  Depression: Denies Anxiety: Denies Mania (lifetime and current): Denies Psychosis: (lifetime and current): Denies  Collateral information:  Patient is homeless and has only sister's emergency contact.  Contacted Jeffrey  [name] at [phone] and left a voicemail.  The sister called back reporting that he never had any prior mental health issues and she had talked to him yesterday and he seemed to be his usual self.  Review of Systems  Psychiatric/Behavioral:  The patient is nervous/anxious.      Psychiatric and Social History  Psychiatric History:  Information collected from patient and medical records.  Patient denies any prior psychiatric history or substance use history.   Social History:  Patient is single has 2 children who are currently not in contact with him.  He reports that he has never had any prior Access to weapons/lethal means: Unknown  Substance History Denies any substance abuse history  Exam Findings  Physical Exam: As per the hospitalist Vital Signs:  Temp:  [98.6 F (37 C)-102.3 F (39.1 C)] 98.6 F (37 C) (01/03 0445) Pulse Rate:  [73-100] 89 (01/03 0445) Resp:  [15-22] 20 (01/03 0800) BP: (96-142)/(59-84) 115/77 (01/03 0800) SpO2:  [95 %-99 %] 98 % (01/03 0445) Weight:  [63.2 kg]  63.2 kg (01/03 0500) Blood pressure 115/77, pulse 89, temperature 98.6 F (37 C), temperature source Oral, resp. rate 20, height 5' 5 (1.651 m), weight 63.2 kg, SpO2 98%. Body mass index is 23.19 kg/m.  Physical Exam  Mental Status Exam: General Appearance: Disheveled  Orientation:  Full (Time, Place, and Person)  Memory:  Immediate;   Fair Recent;   Fair Remote;   Fair  Concentration:  Concentration: Fair and Attention Span: Fair  Recall:  Fair  Attention  Poor  Eye Contact:  Good  Speech:  Normal Rate  Language:  Fair  Volume:  Increased  Mood: Irritable  Affect:  Full Range  Thought Process:  Linear  Thought Content:  Rumination  Suicidal Thoughts:  No  Homicidal Thoughts:  No  Judgement:  Fair  Insight:  Shallow  Psychomotor Activity:  Restlessness  Akathisia:  NA  Fund of Knowledge:  Fair      Assets:  Manufacturing Systems Engineer Desire for Improvement Financial  Resources/Insurance  Cognition:  WNL  ADL's:  Intact  AIMS (if indicated):        Other History   These have been pulled in through the EMR, reviewed, and updated if appropriate.  Family History:  The patient's family history includes Cancer in his mother and sister.  Medical History: Past Medical History:  Diagnosis Date   Cataract    Glaucoma    Hearing deficit    Hepatitis C    Renal disorder     Surgical History: History reviewed. No pertinent surgical history.   Medications:  Current Medications[1]  Allergies: Allergies[2]  Jeffrey BEETS, MD     [1]  Current Facility-Administered Medications:    acetaminophen  (TYLENOL ) tablet 650 mg, 650 mg, Oral, Q6H PRN, 650 mg at 04/07/24 1419 **OR** acetaminophen  (TYLENOL ) suppository 650 mg, 650 mg, Rectal, Q6H PRN, Howerter, Justin B, DO   benzonatate  (TESSALON ) capsule 200 mg, 200 mg, Oral, TID PRN, Howerter, Justin B, DO   Glecaprevir -Pibrentasvir  100-40 MG TABS 3 tablet, 3 tablet, Oral, Q breakfast, Howerter, Justin B, DO   guaiFENesin  (MUCINEX ) 12 hr tablet 600 mg, 600 mg, Oral, BID, Howerter, Justin B, DO, 600 mg at 04/08/24 9176   haloperidol  lactate (HALDOL ) injection 5 mg, 5 mg, Intravenous, Once PRN, Howerter, Justin B, DO   heparin  injection 5,000 Units, 5,000 Units, Subcutaneous, Q12H, Rosario Eland I, MD, 5,000 Units at 04/07/24 2211   melatonin tablet 3 mg, 3 mg, Oral, QHS PRN, Howerter, Justin B, DO   menthol  (CEPACOL) lozenge 3 mg, 1 lozenge, Oral, PRN, Howerter, Justin B, DO   ondansetron  (ZOFRAN ) injection 4 mg, 4 mg, Intravenous, Q6H PRN, Howerter, Justin B, DO   oseltamivir  (TAMIFLU ) capsule 30 mg, 30 mg, Oral, BID, Jarold Planas M, PA-C, 30 mg at 04/08/24 0824 [2] No Known Allergies

## 2024-04-08 NOTE — Evaluation (Signed)
 Occupational Therapy Evaluation Patient Details Name: ABEER [name] MRN: [id number] DOB: [birth year] Today's Date: 04/08/2024   History of Present Illness   69 yo who presented with AMS and fall. Pt with Acute metabolic encephalopathy  and Influenza A infection. EFY:Ypdunmb of HIV, history of chronic hepatitis C, CKD.     Clinical Impressions PTA pt lives in a homeless shelter and uses a rollator for mobility. Pt with unsteady gait at baseline and is high risk for falls- dependent on rollator/cane for mobility. Pt appropriate during session now that he has his belongings, was able to  ambulate @ 150 ft and completed ADL tasks with S. VSS on RA. Acute OT will follow to facilitate safe DC however no OT follow up needed.  Encourage frequent ambulation with staff to reduce restlessness - use rollator.     If plan is discharge home, recommend the following:   Assist for transportation     Functional Status Assessment   Patient has had a recent decline in their functional status and demonstrates the ability to make significant improvements in function in a reasonable and predictable amount of time.     Equipment Recommendations   None recommended by OT     Recommendations for Other Services         Precautions/Restrictions   Precautions Precautions: Fall     Mobility Bed Mobility Overal bed mobility: Independent                  Transfers Overall transfer level: Needs assistance Equipment used: Rollator (4 wheels) Transfers: Sit to/from Stand, Bed to chair/wheelchair/BSC Sit to Stand: Supervision                  Balance Overall balance assessment: History of Falls, Needs assistance Sitting-balance support: Feet supported Sitting balance-Leahy Scale: Good     Standing balance support: Reliant on assistive device for balance, Bilateral upper extremity supported Standing balance-Leahy Scale: Poor                             ADL  either performed or assessed with clinical judgement   ADL Overall ADL's : Needs assistance/impaired                                     Functional mobility during ADLs: Supervision/safety;Rollator (4 wheels) General ADL Comments: overall S for ADL tasks due to unsteadiness     Vision Baseline Vision/History: 1 Wears glasses Ability to See in Adequate Light: 0 Adequate Vision Assessment?: No apparent visual deficits     Perception         Praxis         Pertinent Vitals/Pain Pain Assessment Pain Assessment: Faces Faces Pain Scale: Hurts little more Pain Location: R hip Pain Descriptors / Indicators: Grimacing, Discomfort Pain Intervention(s): Limited activity within patient's tolerance     Extremity/Trunk Assessment Upper Extremity Assessment Upper Extremity Assessment: Overall WFL for tasks assessed   Lower Extremity Assessment Lower Extremity Assessment: Defer to PT evaluation   Cervical / Trunk Assessment Cervical / Trunk Assessment: Kyphotic   Communication Communication Communication: Impaired Factors Affecting Communication: Hearing impaired   Cognition Arousal: Alert Behavior During Therapy: Restless Cognition: No family/caregiver present to determine baseline (most likely at baseline; appropriate, joking and following commands)  Following commands: Intact       Cueing  General Comments          Exercises     Shoulder Instructions      Home Living Family/patient expects to be discharged to:: Shelter/Homeless                                        Prior Functioning/Environment Prior Level of Function : Independent/Modified Independent             Mobility Comments: uses a rollator or cane      OT Problem List: Impaired balance (sitting and/or standing)   OT Treatment/Interventions: Self-care/ADL training;DME and/or AE instruction;Patient/family education       OT Goals(Current goals can be found in the care plan section)   Acute Rehab OT Goals Patient Stated Goal: charge his phone OT Goal Formulation: With patient Time For Goal Achievement: 04/22/24 Potential to Achieve Goals: Good   OT Frequency:  Min 2X/week    Co-evaluation              AM-PAC OT 6 Clicks Daily Activity     Outcome Measure Help from another person eating meals?: None Help from another person taking care of personal grooming?: A Little Help from another person toileting, which includes using toliet, bedpan, or urinal?: A Little Help from another person bathing (including washing, rinsing, drying)?: A Little Help from another person to put on and taking off regular upper body clothing?: A Little Help from another person to put on and taking off regular lower body clothing?: A Little 6 Click Score: 19   End of Session Equipment Utilized During Treatment: Rollator (4 wheels) Nurse Communication: Mobility status  Activity Tolerance: Patient tolerated treatment well Patient left: in chair;with call bell/phone within reach;with chair alarm set  OT Visit Diagnosis: Unsteadiness on feet (R26.81);Other abnormalities of gait and mobility (R26.89);History of falling (Z91.81)                Time: 8375-8349 OT Time Calculation (min): 26 min Charges:  OT General Charges $OT Visit: 1 Visit OT Evaluation $OT Eval Low Complexity: 1 Low OT Treatments $Self Care/Home Management : 8-22 mins  Kreg Sink, OT/L   Acute OT Clinical Specialist Acute Rehabilitation Services Pager [phone] Office [phone]   [facility] 04/08/2024, 5:16 PM

## 2024-04-08 NOTE — Plan of Care (Signed)

## 2024-04-08 NOTE — Progress Notes (Addendum)
 Patient refused a head to toe assessments and AM labs. States he wants to go home. He is currently IVC with a sitter at the bedside. Agreed to take his medications, refused heparin  subcutaneous. Dr. Dennise notified.

## 2024-04-08 NOTE — Care Management (Addendum)
 Requested PT to see patient today Patient agreeable to CT scan, per V.O Dr Dennise, DC order retracted, pending workup for hip pain

## 2024-04-08 NOTE — Progress Notes (Signed)
 CSW informed of plan to rescind IVC.  Form obtained from Dr Larina, Case # [id number].  Form uploaded to [place] [facility] efile site, envelope [id number].  Original placed on pt chart. Cathlyn Ferry, MSW, LCSW 1/3/202612:03 PM

## 2024-04-08 NOTE — Plan of Care (Signed)

## 2024-04-08 NOTE — Discharge Instructions (Signed)
 Follow with Primary MD [name], MD in 7 days   Get CBC, CMP, Magnesium, 2 view Chest X ray -  checked next visit with your primary MD   Activity: As tolerated with Full fall precautions use walker/cane & assistance as needed  Disposition Home    Diet: Heart Healthy    Special Instructions: If you have smoked or chewed Tobacco  in the last 2 yrs please stop smoking, stop any regular Alcohol  and or any Recreational drug use.  On your next visit with your primary care physician please Get Medicines reviewed and adjusted.  Please request your Prim.MD to go over all Hospital Tests and Procedure/Radiological results at the follow up, please get all Hospital records sent to your Prim MD by signing hospital release before you go home.  If you experience worsening of your admission symptoms, develop shortness of breath, life threatening emergency, suicidal or homicidal thoughts you must seek medical attention immediately by calling 911 or calling your MD immediately  if symptoms less severe.  You Must read complete instructions/literature along with all the possible adverse reactions/side effects for all the Medicines you take and that have been prescribed to you. Take any new Medicines after you have completely understood and accpet all the possible adverse reactions/side effects.   Do not drive when taking Pain medications.  Do not take more than prescribed Pain, Sleep and Anxiety Medications  Wear Seat belts while driving.

## 2024-04-09 ENCOUNTER — Inpatient Hospital Stay (HOSPITAL_COMMUNITY)

## 2024-04-09 DIAGNOSIS — G9341 Metabolic encephalopathy: Secondary | ICD-10-CM | POA: Diagnosis not present

## 2024-04-09 MED ORDER — QUETIAPINE FUMARATE 50 MG PO TABS
50.0000 mg | ORAL_TABLET | Freq: Two times a day (BID) | ORAL | Status: DC
  Administered 2024-04-09: 50 mg via ORAL
  Filled 2024-04-09: qty 1

## 2024-04-09 MED ORDER — LORAZEPAM 2 MG/ML IJ SOLN
1.0000 mg | Freq: Once | INTRAMUSCULAR | Status: AC | PRN
  Administered 2024-04-09: 1 mg via INTRAVENOUS
  Filled 2024-04-09: qty 1

## 2024-04-09 MED ORDER — DICLOFENAC SODIUM 1 % EX GEL
2.0000 g | Freq: Four times a day (QID) | CUTANEOUS | Status: DC
  Administered 2024-04-09 – 2024-04-13 (×7): 2 g via TOPICAL
  Filled 2024-04-09: qty 100

## 2024-04-09 MED ORDER — QUETIAPINE FUMARATE 100 MG PO TABS
100.0000 mg | ORAL_TABLET | Freq: Two times a day (BID) | ORAL | Status: DC
  Administered 2024-04-09: 100 mg via ORAL
  Filled 2024-04-09: qty 1

## 2024-04-09 MED ORDER — TRAMADOL HCL 50 MG PO TABS
50.0000 mg | ORAL_TABLET | Freq: Four times a day (QID) | ORAL | Status: DC | PRN
  Administered 2024-04-09 – 2024-04-12 (×4): 50 mg via ORAL
  Filled 2024-04-09 (×4): qty 1

## 2024-04-09 NOTE — Progress Notes (Signed)
 "                                                          [facility]                                                                                                                                                                         Patient Demographics  Jeffrey [name], is a 69 y.o. male  RDW:[id number]  FMW:[id number]  DOB - [birth year]  Admit date - 04/06/2024  Admitting Physician [name], DO  Outpatient Primary MD for the patient is [name], MD  LOS - 2   Chief Complaint  Patient presents with   Medical Clearance      Note patient was discharged on 04/08/2024 upon his request, post discharge he refused to leave and wanted to stay another day as his 23-year-old right hip pain was getting worse and now does not want to leave to a shelter where he came from.   69 y.o. male with medical history significant for chronic hepatitis C, CKD 3B with baseline creatinine 1.5-1.9, who is admitted to [facility] on 04/06/2024 with acute metabolic encephalopathy in the setting of influenza A infection after presenting from homeless shelter to [facility] ED for evaluation of altered mental status.    The patient, who has been residing at homeless shelter, was sent over after staff at shelter noted the patient to be confused, altered, agitated relative to his baseline mental status.  In the ER he was diagnosed with influenza A infection, metabolic encephalopathy, placed under IVC by the ER physician and admitted to the hospital for influenza A infection treatment and encephalopathy management.   Assessment & Plan   Acute metabolic encephalopathy.  in a patient who lives in a homeless shelter due to influenza A infection and ensuing inflammation, CT head -ve, no headache, no focal deficits, he is now alert awake oriented x 3, initially was under IVC in the ER now IVC has been cleared by psych on 04/08/2024, continue treatment for influenza A as below.   Influenza A  infection.  5 days of Tamiflu , currently symptom-free no cough or shortness of breath.  Counseled to be compliant with medication.  Sepsis at the time of admission has resolved.   Extreme agitation and aggressive behavior.  Has been very aggressive towards the staff, PT on 04/08/2024, nurse Aureliano on 04/08/2024, this MD, refusing labs and testing procedures and not participating with care plan.  Discussed with psych and placed on Seroquel .  History of HIV, history of chronic hepatitis C.  Continue home medications. ? compliance,   Follow-up with PCP & ID in the outpatient setting.   CKD stage IIIb.  Baseline creatinine close to 1.6.  At baseline.  Refused labs this morning as he does not want to be poked or disturbed.  Chronic right hip pain x 2 years.  According to the patient he had fallen 2 years ago and since then his right hip pain has been hurting, it is getting worse with time according to him, he had CT of the right hip on 04/08/2024 which was unremarkable, MRI was ordered but he refused, he now agrees for MRI which will be done.  Continue supportive care.   Medications  Scheduled Meds:  diclofenac  Sodium  2 g Topical QID   Glecaprevir -Pibrentasvir   3 tablet Oral Q breakfast   guaiFENesin   600 mg Oral BID   heparin  injection (subcutaneous)  5,000 Units Subcutaneous Q12H   oseltamivir   30 mg Oral BID   QUEtiapine   50 mg Oral BID   Continuous Infusions: PRN Meds:.acetaminophen  **OR** acetaminophen , benzonatate , haloperidol  lactate, LORazepam , melatonin, menthol , metoprolol  tartrate, ondansetron  (ZOFRAN ) IV, traMADol     Time Spent in minutes   10 minutes   Lavada Stank M.D on 04/09/2024 at 8:26 AM  Between 7am to 7pm - Pager - [phone]  After 7pm go to [email or web address] - password [email or web address]  And look for the night coverage person covering for me after hours  [facility] Office  [phone]    Subjective:   [name] today has, No headache, No chest pain, No  abdominal pain - No Nausea, No new weakness tingling or numbness, No Cough - SOB.  Does have chronic right-sided hip pain for 2 years upon ambulation  Objective:   Vitals:   04/08/24 2242 04/08/24 2339 04/09/24 0332 04/09/24 0500  BP:  98/65 121/74   Pulse:  91 91   Resp: 19 18 12    Temp:  98.4 F (36.9 C) 98.4 F (36.9 C)   TempSrc:  Oral Oral   SpO2:  100%    Weight:    53.4 kg  Height:        Wt Readings from Last 3 Encounters:  04/09/24 53.4 kg  12/16/23 63.5 kg  11/28/23 63.5 kg     Intake/Output Summary (Last 24 hours) at 04/09/2024 0826 Last data filed at 04/08/2024 1300 Gross per 24 hour  Intake 360 ml  Output --  Net 360 ml    Exam  Awake but extremely agitated, no new F.N deficits,   [place].AT,PERRAL Supple Neck, No JVD,   Symmetrical Chest wall movement, Good air movement bilaterally, CTAB RRR,No Gallops, Rubs or new Murmurs,  +ve B.Sounds, Abd Soft, No tenderness,   No Cyanosis, Clubbing or edema   Data Review        "

## 2024-04-09 NOTE — Progress Notes (Signed)
 Patient is refusing to wear telemetry monitor.  Patient was educated but is very verbally abusive to nursing staff.  Dr. Dennise notified.

## 2024-04-09 NOTE — Progress Notes (Signed)
 Triued to do admission patient asleep competed some from notes has some cognitive impairment per notes

## 2024-04-09 NOTE — Progress Notes (Signed)
 Patient refused heparin  injection.  Patient educated and is verbally abusive to staff. Dr. Dennise notified.

## 2024-04-09 NOTE — Evaluation (Signed)
 Physical Therapy Evaluation Patient Details Name: Jeffrey [name] MRN: [id number] DOB: Aug 09, 1955 Today's Date: 04/09/2024  History of Present Illness  69 y.o. who presented 04/06/24 to [facility] ED with AMS and fall. Pt with Acute metabolic encephalopathy and Influenza A infection. PMHx:  HIV, chronic hepatitis C, and CKD.  Clinical Impression  Pt admitted with above diagnosis. PTA, pt was modI for functional mobility using a rollator. He is un-housed and reports staying at a homeless shelter with [employer]. Pt currently with functional limitations due to the deficits listed below (see PT Problem List). He performed bed mobility with modI and required CGA for transfers and gait using rollator. Pt is currently limited by impaired cognition, labile behavior, impaired balance, and decreased activity tolerance. Pt will benefit from acute skilled PT to increase his independence and safety with mobility to allow discharge. Recommend OPPT to increase strength, improve balance, decrease fall risk, and optimize safety and independence with functional mobility.     If plan is discharge home, recommend the following: A little help with walking and/or transfers;A little help with bathing/dressing/bathroom;Assist for transportation;Help with stairs or ramp for entrance   Can travel by private vehicle        Equipment Recommendations None recommended by PT (Pt reportedly has a rollator and SPC)  Recommendations for Other Services       Functional Status Assessment Patient has had a recent decline in their functional status and demonstrates the ability to make significant improvements in function in a reasonable and predictable amount of time.     Precautions / Restrictions Precautions Precautions: Fall Recall of Precautions/Restrictions: Impaired Restrictions Weight Bearing Restrictions Per Provider Order: No      Mobility  Bed Mobility Overal bed mobility: Modified Independent              General bed mobility comments: HOB elevated. Increased time.    Transfers Overall transfer level: Needs assistance Equipment used: Rollator (4 wheels) Transfers: Sit to/from Stand, Bed to chair/wheelchair/BSC Sit to Stand: Contact guard assist, Supervision           General transfer comment: Pt stood from lowest bed height. Cued pt to lock/unlock rollator breaks. He powered up without physical assist. CGA for safety. Fair eccentric control with sitting. Pt scooted along side of bed to prior to return to supine.    Ambulation/Gait Ambulation/Gait assistance: Contact guard assist, Supervision Gait Distance (Feet): 50 Feet Assistive device: Rollator (4 wheels) Gait Pattern/deviations: Trunk flexed, Step-to pattern Gait velocity: decr Gait velocity interpretation: <1.8 ft/sec, indicate of risk for recurrent falls   General Gait Details: Pt ambulated with short slow steps. He lacked foot clearence on LLE, dragging his foot along the ground. Cued increased hip/knee flex with no adapation by pt. He maintained a slight fwd lean. Pt's gait speed varied significantly between very slow/cautions to quick as he became upset/frustrated. Moderate cues for sequenecing and safety awareness.  Stairs            Wheelchair Mobility     Tilt Bed    Modified Rankin (Stroke Patients Only)       Balance Overall balance assessment: History of Falls, Needs assistance Sitting-balance support: Feet supported, No upper extremity supported Sitting balance-Leahy Scale: Fair Sitting balance - Comments: Sat EOB   Standing balance support: Reliant on assistive device for balance, Bilateral upper extremity supported, During functional activity Standing balance-Leahy Scale: Poor Standing balance comment: Pt dependent on rollator  Pertinent Vitals/Pain Pain Assessment Pain Assessment: Faces Faces Pain Scale: Hurts little more Pain Location: R hip Pain  Descriptors / Indicators: Grimacing, Discomfort Pain Intervention(s): Monitored during session, Limited activity within patient's tolerance, Repositioned    Home Living Family/patient expects to be discharged to:: Shelter/Homeless                   Additional Comments: Pt is a questionable historian. He claims that he is on disability and stays with [employer].    Prior Function Prior Level of Function : Patient poor historian/Family not available;Independent/Modified Independent             Mobility Comments: Ambulates using rollator or cane. 1 fall prior to admit. Pt denies fall hx. ADLs Comments: Pt reports he can take care of himself.     Extremity/Trunk Assessment   Upper Extremity Assessment Upper Extremity Assessment: Defer to OT evaluation    Lower Extremity Assessment Lower Extremity Assessment: Difficult to assess due to impaired cognition;RLE deficits/detail;LLE deficits/detail RLE Deficits / Details: AROM WFL. Pt did not understand the commands or want to follow instructions for MMT. At least 3/5 strength observed functionally. LLE Deficits / Details: Decreased ankle DF. Pt only demonstrated ability to lift his toes and drug foot across the ground during gait. Hip and Knee AROM WFL. Pt did not understand the commands or want to follow instructions for MMT. At least 3/5 strength observed functionally at hip and knee.    Cervical / Trunk Assessment Cervical / Trunk Assessment: Kyphotic  Communication   Communication Communication: Impaired Factors Affecting Communication: Hearing impaired    Cognition Arousal: Alert Behavior During Therapy: Restless, Agitated   PT - Cognitive impairments: No family/caregiver present to determine baseline, Problem solving, Safety/Judgement                       PT - Cognition Comments: Pt with limited verbalizations and engagement in therapy session. He opted to not respond to questions. Pt became  increasingly irritated as session progressed and voiced multiple complaints. Utilized active listening strategies and offered support to pt. He stated leave me alone, I am done! Following commands: Impaired Following commands impaired: Only follows one step commands consistently, Follows one step commands with increased time     Cueing Cueing Techniques: Verbal cues, Gestural cues     General Comments      Exercises     Assessment/Plan    PT Assessment Patient needs continued PT services  PT Problem List Decreased strength;Decreased activity tolerance;Decreased balance;Decreased mobility;Decreased safety awareness       PT Treatment Interventions DME instruction;Gait training;Functional mobility training;Therapeutic activities;Therapeutic exercise;Balance training;Patient/family education    PT Goals (Current goals can be found in the Care Plan section)  Acute Rehab PT Goals Patient Stated Goal: Leave asap PT Goal Formulation: With patient Time For Goal Achievement: 04/23/24 Potential to Achieve Goals: Fair    Frequency Min 2X/week     Co-evaluation               AM-PAC PT 6 Clicks Mobility  Outcome Measure Help needed turning from your back to your side while in a flat bed without using bedrails?: A Little Help needed moving from lying on your back to sitting on the side of a flat bed without using bedrails?: A Little Help needed moving to and from a bed to a chair (including a wheelchair)?: A Little Help needed standing up from a chair using your arms (e.g., wheelchair  or bedside chair)?: A Little Help needed to walk in hospital room?: A Little Help needed climbing 3-5 steps with a railing? : A Lot 6 Click Score: 17    End of Session Equipment Utilized During Treatment: Other (comment) (pt refused gait belt stating I know not to fall, attempted education for safety with no correction and pt becoming hostile.) Activity Tolerance: Patient tolerated treatment  well;Treatment limited secondary to agitation Patient left: in bed;with call bell/phone within reach;with bed alarm set Nurse Communication: Mobility status PT Visit Diagnosis: History of falling (Z91.81);Other abnormalities of gait and mobility (R26.89);Difficulty in walking, not elsewhere classified (R26.2);Unsteadiness on feet (R26.81)    Time: 8841-8789 PT Time Calculation (min) (ACUTE ONLY): 12 min   Charges:   PT Evaluation $PT Eval Moderate Complexity: 1 Mod   PT General Charges $$ ACUTE PT VISIT: 1 Visit         Randall SAUNDERS, PT, DPT Acute Rehabilitation Services Office: [phone] Secure Chat Preferred  Delon CHRISTELLA Callander 04/09/2024, 1:29 PM

## 2024-04-09 NOTE — Progress Notes (Signed)
 Pt was inquiring about his rollator. When inquiring about a description of it, pt became verbally aggressive towards nurse, due to the fact of patient giving 3 different descriptions of what it looked like. Pt reminded I am trying to help him, so compliance with my questions would be appreciated. Pt continued to be verbally aggressive as witnessed by Dr. Dennise. When asking ED about rollator they did not have it. Pt rollator was at group home.  Anajah Sterbenz H Tamala Manzer 04/08/2024 0900

## 2024-04-09 NOTE — Plan of Care (Signed)
" °  Problem: Activity: Goal: Risk for activity intolerance will decrease Outcome: Progressing   Problem: Coping: Goal: Level of anxiety will decrease Outcome: Not Progressing   Problem: Safety: Goal: Ability to remain free from injury will improve Outcome: Not Progressing   Patient refuses all education from staff and refuses to follow safety precautions.  MD notified. "

## 2024-04-09 NOTE — Consult Note (Signed)
 [facility] Psychiatric Consult Initial  Patient Name: .Jeffrey [name]  MRN: [id number]  DOB: 04-Jun-1955  Consult Order details:  Orders (From admission, onward)     Start     Ordered   04/08/24 0743  IP CONSULT TO PSYCHIATRY       Ordering Provider: Dennise Lavada POUR, MD  Provider:  (Not yet assigned)  Question Answer Comment  Location [facility]   Reason for Consult? Aggressive behaviour, placed under IVC in the ER 2 days ago, now wants to leaved the hospital - ? any danger to self or rothers, is it OK to DC IVC. Encephalopathy is much better but still aggressive      04/08/24 0743             Mode of Visit: In person    Psychiatry Consult Evaluation  Service Date: April 09, 2024 LOS:  LOS: 2 days  Chief Complaint 69 year old male who is on IVC demanding to leave.  Consult was to assess the need to continue the IVC.  Primary Psychiatric Diagnoses  Recent history of delirium. 2.  No past psych history on record 3.  Denies depression and substance use disorder.  Assessment  Jeffrey [name] is a 69 y.o. male admitted: Medicallyfor 04/06/2024  8:56 PM for altered mental status. He carries the psychiatric diagnoses of delirium and has a past medical history of as documented.   The patient was IVC in the ED, apparently he had a history of right hip pain and claims that he had bone cancer.  He was seen with a high fever and delirium in the ED and was IVC initially because he was demanding to leave.  However, he has no prior psychiatric history.  He has no drugs in his UDS.  When he was seen today around 10 AM, he was extremely irritable and was upset because he could not find his roller walker which he claims that he brought to the hospital.  His primary concern was that he uses that for ambulation.  He claims that he is on disability and he stays with [employer]. When seen today he was alert, oriented to time place and person.  His speech was rapid loud mildly  pressured and irritable because he was being detained in the hospital.  He denies any history of alcohol or substance use disorder and denies any SI/HI/AVH.  Currently he does not have any imminent risk to self or others other than his extreme irritability and anger.  We recommend releasing him from the IVC.  Discharge is at the discretion of the attending if he is medically stable. TOC consult recommended for outpatient follow-up.  04/09/2024: The patient was not discharged yesterday at his request.  He was just seen today as a follow-up given the fact that he was recently started on Seroquel .  He seems to be doing better on the Seroquel  but remains extremely irritable labile and aggressive.  Although the patient does not have a primary psychiatric diagnosis and has not been described as being psychotic or suicidal or homicidal he remains labile and aggressive and probably still delirious.  Psychiatry will be on standby if he ends up becoming unmanageable.  He currently does not meet the criteria for an IVC but clearly lacks capacity today to make his own decisions.  Diagnoses:  Active Hospital problems: Principal Problem:   Acute metabolic encephalopathy Active Problems:   Chronic hepatitis C (HCC)   CKD stage 3b, GFR 30-44 ml/min (HCC)  Severe sepsis (HCC)   Influenza A   Fever    Plan   ## Psychiatric Medication Recommendations:  None  ## Medical Decision Making Capacity: Not specifically addressed in this encounter  ## Further Work-up:  -- As per the hospitalist EKG - -- Pertinent labwork reviewed earlier this admission includes: As per the hospitalist   ## Disposition:-- There are no psychiatric contraindications to discharge at this time  ## Behavioral / Environmental: -Difficult Patient (SELECT OPTIONS FROM BELOW) or Utilize compassion and acknowledge the patient's experiences while setting clear and realistic expectations for care.    ## Safety and Observation Level:   - Based on my clinical evaluation, I estimate the patient to be at Low risk of self harm in the current setting. - At this time, we recommend  routine. This decision is based on my review of the chart including patient's history and current presentation, interview of the patient, mental status examination, and consideration of suicide risk including evaluating suicidal ideation, plan, intent, suicidal or self-harm behaviors, risk factors, and protective factors. This judgment is based on our ability to directly address suicide risk, implement suicide prevention strategies, and develop a safety plan while the patient is in the clinical setting. Please contact our team if there is a concern that risk level has changed.  CSSR Risk Category:   Suicide Risk Assessment: Patient has following modifiable risk factors for suicide: None which we are addressing by outpatient appointments and medical care. Patient has following non-modifiable or demographic risk factors for suicide: male gender Patient has the following protective factors against suicide: Cultural, spiritual, or religious beliefs that discourage suicide and no history of suicide attempts  Thank you for this consult request. Recommendations have been communicated to the primary team.  We will sign off at this time.   PAULETTE BEETS, MD       History of Present Illness  Relevant Aspects of [name] Course:  Admitted on 04/06/2024 for altered mental status.   Patient Report:  The patient is a 69 year old male with no prior psychiatric history and no prior substance use history was admitted through the ED with a delirium and extreme irritability.  Apparently lives at the [facility].  At the shelter he was noted to be confused with altered mental status and because he was irritable and labile and was refusing care.  Once he was admitted the patient was apparently stabilized and per the hospitalist patient was  stable with no delirium.  When the patient was assessed he was very lucid and alert, oriented and irritable but knew he was in the hospital and denied any prior history of depression anxiety or psychosis and denied any active SI/HI/AVH. His main anger was because he is missing his walker but he denies any other psychiatric symptoms. Given the lack of criteria for IVC, he is being released from the IVC.  04/09/2024: The patient was not discharged yesterday at his request.  He was just seen today as a follow-up given the fact that he was recently started on Seroquel .  When seen today patient was lying in bed and seemed mildly tremulous.  He was looking around on the floor and claims that he was searching for 2 tablets that he is accidentally dropped on the floor.  Apparently the nurse had given him 2 tablets to take.  When I asked him what tablets he became extremely irritable angry and labile.  The tablets were still on the table in a cup  but he seemed to have trouble identifying them.  He still denies any active SI/HI/AVH and does not appear to be hallucinating.  He currently does not meet the criteria for an IVC but clearly lacks capacity today to make his own decisions. Psych ROS:  Depression: Denies Anxiety: Denies Mania (lifetime and current): Denies Psychosis: (lifetime and current): Denies  Collateral information:  Patient is homeless and has only sister's emergency contact.  Contacted Monica Wims at [phone] and left a voicemail.  The sister called back reporting that he never had any prior mental health issues and she had talked to him yesterday and he seemed to be his usual self.  Review of Systems  Psychiatric/Behavioral:  The patient is nervous/anxious.      Psychiatric and Social History  Psychiatric History:  Information collected from patient and medical records.  Patient denies any prior psychiatric history or substance use history.   Social History:  Patient is single  has 2 children who are currently not in contact with him.  He reports that he has never had any prior Access to weapons/lethal means: Unknown  Substance History Denies any substance abuse history  Exam Findings  Physical Exam: As per the hospitalist Vital Signs:  Temp:  [97.5 F (36.4 C)-98.6 F (37 C)] 98 F (36.7 C) (01/04 1019) Pulse Rate:  [88-101] 88 (01/04 1019) Resp:  [12-19] 16 (01/04 1019) BP: (98-137)/(65-83) 109/73 (01/04 1019) SpO2:  [98 %-100 %] 100 % (01/04 1019) Weight:  [53.4 kg] 53.4 kg (01/04 0500) Blood pressure 109/73, pulse 88, temperature 98 F (36.7 C), temperature source Oral, resp. rate 16, height 5' 5 (1.651 m), weight 53.4 kg, SpO2 100%. Body mass index is 19.59 kg/m.  Physical Exam  Mental Status Exam: General Appearance: Disheveled  Orientation:  Full (Time, Place, and Person)  Memory:  Immediate;   Fair Recent;   Fair Remote;   Fair  Concentration:  Concentration: Fair and Attention Span: Fair  Recall:  Fair  Attention  Poor  Eye Contact:  Good  Speech:  Normal Rate  Language:  Fair  Volume:  Increased  Mood: Irritable  Affect:  Full Range  Thought Process:  Linear  Thought Content:  Rumination  Suicidal Thoughts:  No  Homicidal Thoughts:  No  Judgement:  Fair  Insight:  Shallow  Psychomotor Activity:  Restlessness  Akathisia:  NA  Fund of Knowledge:  Fair      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  Family History:  The patient's family history includes Cancer in his mother and sister.  Medical History: Past Medical History:  Diagnosis Date   Cataract    Glaucoma    Hearing deficit    Hepatitis C    Renal disorder     Surgical History: History reviewed. No pertinent surgical history.   Medications:  Current  Medications[1]  Allergies: Allergies[2]  PAULETTE BEETS, MD      [1]  Current Facility-Administered Medications:    acetaminophen  (TYLENOL ) tablet 650 mg, 650 mg, Oral, Q6H PRN, 650 mg at 04/07/24 1419 **OR** acetaminophen  (TYLENOL ) suppository 650 mg, 650 mg, Rectal, Q6H PRN, Howerter, Justin B, DO   benzonatate  (TESSALON ) capsule 200 mg, 200 mg, Oral, TID PRN, Howerter, Justin B, DO   diclofenac  Sodium (VOLTAREN ) 1 %  topical gel 2 g, 2 g, Topical, QID, Singh, Prashant K, MD, 2 g at 04/09/24 1239   Glecaprevir -Pibrentasvir  100-40 MG TABS 3 tablet, 3 tablet, Oral, Q breakfast, Howerter, Justin B, DO   guaiFENesin  (MUCINEX ) 12 hr tablet 600 mg, 600 mg, Oral, BID, Howerter, Justin B, DO, 600 mg at 04/09/24 1028   haloperidol  lactate (HALDOL ) injection 5 mg, 5 mg, Intravenous, Once PRN, Howerter, Justin B, DO   heparin  injection 5,000 Units, 5,000 Units, Subcutaneous, Q12H, Rosario Eland I, MD, 5,000 Units at 04/07/24 2211   melatonin tablet 3 mg, 3 mg, Oral, QHS PRN, Howerter, Justin B, DO   menthol  (CEPACOL) lozenge 3 mg, 1 lozenge, Oral, PRN, Howerter, Justin B, DO   metoprolol  tartrate (LOPRESSOR ) injection 2.5 mg, 2.5 mg, Intravenous, Q5 min PRN, Sundil, Subrina, MD   ondansetron  (ZOFRAN ) injection 4 mg, 4 mg, Intravenous, Q6H PRN, Howerter, Justin B, DO   oseltamivir  (TAMIFLU ) capsule 30 mg, 30 mg, Oral, BID, Jarold Planas M, PA-C, 30 mg at 04/09/24 1028   QUEtiapine  (SEROQUEL ) tablet 100 mg, 100 mg, Oral, BID, Singh, Prashant K, MD   traMADol  (ULTRAM ) tablet 50 mg, 50 mg, Oral, Q6H PRN, Singh, Prashant K, MD, 50 mg at 04/09/24 0844 [2] No Known Allergies

## 2024-04-09 NOTE — Progress Notes (Signed)
 PT Cancellation Note  Patient Details Name: Jeffrey [name] MRN: [id number] DOB: [birth year]   Cancelled Treatment:    Reason Eval/Treat Not Completed: Patient at procedure or test/unavailable (Pt off floor for MRI. Will follow-up PT evaluation as schedule permits.)  Jeffrey [name], PT, DPT Acute Rehabilitation Services Office: [phone] Secure Chat Preferred  Jeffrey [name] 04/09/2024, 8:49 AM

## 2024-04-09 NOTE — Plan of Care (Signed)

## 2024-04-10 DIAGNOSIS — G9341 Metabolic encephalopathy: Secondary | ICD-10-CM | POA: Diagnosis not present

## 2024-04-10 LAB — PATHOLOGIST SMEAR REVIEW: Path Review: NEGATIVE

## 2024-04-10 MED ORDER — METHYLPREDNISOLONE SODIUM SUCC 40 MG IJ SOLR
40.0000 mg | INTRAMUSCULAR | Status: DC
  Administered 2024-04-10 – 2024-04-12 (×3): 40 mg via INTRAVENOUS
  Filled 2024-04-10 (×4): qty 1

## 2024-04-10 MED ORDER — QUETIAPINE FUMARATE 100 MG PO TABS
100.0000 mg | ORAL_TABLET | Freq: Every day | ORAL | Status: DC
  Administered 2024-04-10: 100 mg via ORAL
  Filled 2024-04-10: qty 1

## 2024-04-10 MED ORDER — QUETIAPINE FUMARATE 50 MG PO TABS
50.0000 mg | ORAL_TABLET | Freq: Every day | ORAL | Status: DC
  Filled 2024-04-10: qty 1

## 2024-04-10 NOTE — TOC Initial Note (Signed)
 Transition of Care [facility]) - Initial/Assessment Note    Patient Details  Name: Jeffrey [name] MRN: [id number] Date of Birth: 25-Oct-1955  Transition of Care [facility]) CM/SW Contact:    Jeffrey GORMAN Kindle, LCSW Phone Number: 04/10/2024, 11:23 AM  Clinical Narrative:                 Patient requested clothing. CSW and OT obtained clothes for patient but he declined them, stating they were too big. CSW returned clothes to the clothing closet.   CSW received request to provide resources due to homelessness. CSW and RNCM met with patient who stated he does not want to return to the shelter but that he is waiting on his check to come in and he will find a place to live. CSW provided community resources and a rental housing listing found online. CSW discussed [employer] as well but patient declined stating he is not going to do chores as he is disabled. Patient asked CSW what next steps is the hospital going to take for him and CSW explained that he will have to follow up on the resources himself as there are assessments and waitlists. Patient was not happy with CSW but confirmed he has a PCP and agreed to outpatient PT with RNCM.    Expected Discharge Plan: Homeless Shelter Barriers to Discharge: Continued Medical Work up   Patient Goals and CMS Choice            Expected Discharge Plan and Services In-house Referral: Clinical Social Work Discharge Planning Services: CM Consult   Living arrangements for the past 2 months: Homeless Shelter Expected Discharge Date: 04/08/24                                    Prior Living Arrangements/Services Living arrangements for the past 2 months: Homeless Shelter Lives with:: Self Patient language and need for interpreter reviewed:: Yes Do you feel safe going back to the place where you live?: Yes      Need for Family Participation in Patient Care: No (Comment) Care giver support system in place?: No (comment) Current home services:  DME Criminal Activity/Legal Involvement Pertinent to Current Situation/Hospitalization: No - Comment as needed  Activities of Daily Living   ADL Screening (condition at time of admission) Independently performs ADLs?: No (needs asistance due to current physical state per PT notes) Does the patient have a NEW difficulty with bathing/dressing/toileting/self-feeding that is expected to last >3 days?: Yes (Initiates electronic notice to provider for possible OT consult) Does the patient have a NEW difficulty with getting in/out of bed, walking, or climbing stairs that is expected to last >3 days?: Yes (Initiates electronic notice to provider for possible PT consult) Does the patient have a NEW difficulty with communication that is expected to last >3 days?: No  Permission Sought/Granted                  Emotional Assessment Appearance:: Appears stated age Attitude/Demeanor/Rapport: Guarded, Complaining Affect (typically observed): Irritable Orientation: : Oriented to Self, Oriented to Place, Oriented to Situation, Oriented to  Time Alcohol / Substance Use: Not Applicable Psych Involvement: Yes (comment)  Admission diagnosis:  Influenza A [J10.1] Altered mental status, unspecified altered mental status type [R41.82] Acute metabolic encephalopathy [G93.41] Patient Active Problem List   Diagnosis Date Noted   Acute metabolic encephalopathy 04/07/2024   Severe sepsis (HCC) 04/07/2024   Influenza A  04/07/2024   Fever 04/07/2024   Right hip pain 09/09/2023   Hepatoma (HCC) 06/17/2023   Alcoholic cirrhosis, unspecified whether ascites present (HCC) 06/17/2023   Glaucoma of both eyes 06/17/2023   Frequent falls 06/17/2023   Cataract of both eyes 06/17/2023   Dental disease 06/17/2023   Sheltered homelessness 06/17/2023   Chronic hepatitis C (HCC) 01/27/2018   CKD stage 3b, GFR 30-44 ml/min (HCC) 01/27/2018   Decreased hearing of left ear 01/27/2018   Bilateral chronic knee pain  01/27/2018   Alcohol dependence (HCC) 01/27/2018   Tobacco use 01/27/2018   PCP:  Jeffrey Belvie BRAVO, MD Pharmacy:   [facility] - [place], [place] - [address] [place] [address] Phone: [phone] Fax: [phone]  [facility] [address] [place] [address] Phone: [phone] Fax: [phone]  Jeffrey [name] - [facility] [address] [place] [address] Phone: [phone] Fax: [phone]     Social Drivers of Health (SDOH) Social History: SDOH Screenings   Food Insecurity: Food Insecurity Present (04/08/2024)  Housing: Unknown (04/09/2024)  Recent Concern: Housing - High Risk (04/08/2024)  Transportation Needs: Unmet Transportation Needs (04/09/2024)  Utilities: At Risk (04/09/2024)  Depression (PHQ2-9): Low Risk (07/07/2023)  Recent Concern: Depression (PHQ2-9) - Medium Risk (06/17/2023)  Tobacco Use: High Risk (04/07/2024)   SDOH Interventions: Food Insecurity Interventions: [employer] Provided, Inpatient [employer] Transportation Interventions: [employer] Provided, Inpatient TOC Utilities Interventions: [employer] Provided, Inpatient TOC   Readmission Risk Interventions     No data to display

## 2024-04-10 NOTE — Progress Notes (Signed)
 "                                                          [facility]                                                                                                                                                                         Patient Demographics  Antonis Lor, is a 69 y.o. male  RDW:[id number]  FMW:[id number]  DOB - 05/11/1955  Admit date - 04/06/2024  Admitting Physician [name], DO  Outpatient Primary MD for the patient is [name], MD  LOS - 3   Chief Complaint  Patient presents with   Medical Clearance      Note patient was discharged on 04/08/2024 upon his request, post discharge he refused to leave and wanted to stay another day as his 68-year-old right hip pain was getting worse and now does not want to leave to a shelter where he came from.   69 y.o. male with medical history significant for chronic hepatitis C, CKD 3B with baseline creatinine 1.5-1.9, who is admitted to [facility] on 04/06/2024 with acute metabolic encephalopathy in the setting of influenza A infection after presenting from homeless shelter to [facility] ED for evaluation of altered mental status.    The patient, who has been residing at homeless shelter, was sent over after staff at shelter noted the patient to be confused, altered, agitated relative to his baseline mental status.  In the ER he was diagnosed with influenza A infection, metabolic encephalopathy, placed under IVC by the ER physician and admitted to the hospital for influenza A infection treatment and encephalopathy management.   Assessment & Plan   Acute metabolic encephalopathy.  in a patient who lives in a homeless shelter due to influenza A infection and ensuing inflammation, CT head -ve, no headache, no focal deficits, he is now alert awake oriented x 3, initially was under IVC in the ER now IVC has been cleared by psych on 04/08/2024, continue treatment for influenza A as below.   Influenza A  infection.  5 days of Tamiflu , currently symptom-free no cough or shortness of breath.  Counseled to be compliant with medication.  Sepsis at the time of admission has resolved.   Extreme agitation and aggressive behavior.  Has been very aggressive towards the staff, PT on 04/08/2024, nurse Aureliano on 04/08/2024, this MD, refusing labs and testing procedures and not participating with care plan.  Discussed with psych and placed on Seroquel .  History of HIV, history of chronic hepatitis C.  Continue home medications. ? compliance,   Follow-up with PCP & ID in the outpatient setting.   CKD stage IIIb.  Baseline creatinine close to 1.6.  At baseline.  Refused labs this morning as he does not want to be poked or disturbed.  Chronic right hip pain x 2 years.  According to the patient he had fallen 2 years ago and since then his right hip pain has been hurting, it is getting worse with time according to him, CT and MRI of right hip both nonacute, chronic findings noted, continue supportive care with outpatient orthopedics follow-up per PCP.   Medications  Scheduled Meds:  diclofenac  Sodium  2 g Topical QID   Glecaprevir -Pibrentasvir   3 tablet Oral Q breakfast   guaiFENesin   600 mg Oral BID   heparin  injection (subcutaneous)  5,000 Units Subcutaneous Q12H   oseltamivir   30 mg Oral BID   QUEtiapine   100 mg Oral QHS   [START ON 04/11/2024] QUEtiapine   50 mg Oral Q0600   Continuous Infusions: PRN Meds:.acetaminophen  **OR** acetaminophen , benzonatate , haloperidol  lactate, melatonin, menthol , metoprolol  tartrate, ondansetron  (ZOFRAN ) IV, traMADol     Time Spent in minutes   10 minutes   Lavada Stank M.D on 04/10/2024 at 7:42 AM  Between 7am to 7pm - Pager - [phone]  After 7pm go to [email or web address] - password [email or web address]  And look for the night coverage person covering for me after hours  [facility] Office  [phone]    Subjective:   In bed denies any headache chest or abdominal  pain, chronic right-sided hip pain upon ambulation, does not want to talk or be disturbed,  Objective:   Vitals:   04/09/24 2346 04/09/24 2352 04/10/24 0338 04/10/24 0400  BP: (!) 87/53 (!) 97/59 (!) 94/59 94/63  Pulse:  71 75 64  Resp:  14 11 12   Temp: (!) 97.5 F (36.4 C)  97.6 F (36.4 C)   TempSrc: Axillary  Axillary   SpO2:  98% 98% 97%  Weight:    63.3 kg  Height:        Wt Readings from Last 3 Encounters:  04/10/24 63.3 kg  12/16/23 63.5 kg  11/28/23 63.5 kg     Intake/Output Summary (Last 24 hours) at 04/10/2024 0742 Last data filed at 04/09/2024 0745 Gross per 24 hour  Intake --  Output 400 ml  Net -400 ml    Exam  Awake but gets increasingly angry after a few questions, no new F.N deficits, supple neck, denies any headache [place].AT,PERRAL Supple Neck,  Symmetrical Chest wall movement, Good air movement bilaterally, CTAB RRR,No Gallops, Rubs or new Murmurs,  +ve B.Sounds, Abd Soft, No tenderness,   No Cyanosis, Clubbing or edema   Data Review        "

## 2024-04-10 NOTE — Consult Note (Signed)
 Reason for Consult:Right hip pain Referring Physician: Lavada Stank Time called: 1147 Time at bedside: 1158   Jeffrey [name] is an 69 y.o. male.  HPI: Jeffrey [name] was admitted 3d ago with confusion 2/2 flu. He requested orthopedic surgery consultation for right hip pain of 2y duration. He says it began when he was attacked by 2 pit bulls. He states it has only been getting worse. He has tried NSAID's and topical analgesics without improvement. He denies aggravating factors but says heat will make it feel better.  Past Medical History:  Diagnosis Date   Cataract    Glaucoma    Hearing deficit    Hepatitis C    Renal disorder     History reviewed. No pertinent surgical history.  Family History  Problem Relation Age of Onset   Cancer Mother    Cancer Sister     Social History:  reports that he has been smoking cigarettes. He has never used smokeless tobacco. He reports current alcohol use. He reports that he does not use drugs.  Allergies: Allergies[1]  Medications: I have reviewed the patient's current medications.  Results for orders placed or performed during the hospital encounter of 04/06/24 (from the past 48 hours)  Comprehensive metabolic panel with GFR     Status: Abnormal   Collection Time: 04/08/24  6:58 PM  Result Value Ref Range   Sodium 136 135 - 145 mmol/L   Potassium 3.7 3.5 - 5.1 mmol/L    Comment: HEMOLYSIS AT THIS LEVEL MAY AFFECT RESULT   Chloride 103 98 - 111 mmol/L   CO2 20 (L) 22 - 32 mmol/L   Glucose, Bld 105 (H) 70 - 99 mg/dL    Comment: Glucose reference range applies only to samples taken after fasting for at least 8 hours.   BUN 29 (H) 8 - 23 mg/dL   Creatinine, Ser 8.40 (H) 0.61 - 1.24 mg/dL   Calcium  8.4 (L) 8.9 - 10.3 mg/dL   Total Protein 6.7 6.5 - 8.1 g/dL   Albumin 3.6 3.5 - 5.0 g/dL   AST 50 (H) 15 - 41 U/L    Comment: HEMOLYSIS AT THIS LEVEL MAY AFFECT RESULT   ALT 30 0 - 44 U/L   Alkaline Phosphatase 87 38 - 126 U/L   Total Bilirubin  0.2 0.0 - 1.2 mg/dL   GFR, Estimated 47 (L) >60 mL/min    Comment: (NOTE) Calculated using the CKD-EPI Creatinine Equation (2021)    Anion gap 12 5 - 15    Comment: Performed at [facility], [address] [place], [place] [address]  Magnesium     Status: None   Collection Time: 04/08/24  6:58 PM  Result Value Ref Range   Magnesium 2.2 1.7 - 2.4 mg/dL    Comment: Performed at [facility], [address], [place], [place] [address]  Ammonia     Status: None   Collection Time: 04/08/24  6:58 PM  Result Value Ref Range   Ammonia 33 9 - 35 umol/L    Comment: Performed at [facility], [address], [place], [place] [address]  CBC with Differential/Platelet     Status: Abnormal   Collection Time: 04/08/24  6:58 PM  Result Value Ref Range   WBC 3.0 (L) 4.0 - 10.5 K/uL   RBC 3.66 (L) 4.22 - 5.81 MIL/uL   Hemoglobin 12.0 (L) 13.0 - 17.0 g/dL   HCT 64.4 (L) 60.9 - 47.9 %   MCV  97.0 80.0 - 100.0 fL   MCH 32.8 26.0 - 34.0 pg   MCHC 33.8 30.0 - 36.0 g/dL   RDW 86.7 88.4 - 84.4 %   Platelets 130 (L) 150 - 400 K/uL   nRBC 0.0 0.0 - 0.2 %   Neutrophils Relative % 28 %   Neutro Abs 0.9 (L) 1.7 - 7.7 K/uL   Lymphocytes Relative 53 %   Lymphs Abs 1.6 0.7 - 4.0 K/uL   Monocytes Relative 14 %   Monocytes Absolute 0.4 0.1 - 1.0 K/uL   Eosinophils Relative 3 %   Eosinophils Absolute 0.1 0.0 - 0.5 K/uL   Basophils Relative 1 %   Basophils Absolute 0.0 0.0 - 0.1 K/uL   Immature Granulocytes 1 %   Abs Immature Granulocytes 0.04 0.00 - 0.07 K/uL    Comment: Performed at [facility], [address], [place], [place] [address]    MR HIP RIGHT WO CONTRAST Result Date: 04/09/2024 CLINICAL DATA:  Right hip pain. EXAM: MR OF THE RIGHT HIP WITHOUT CONTRAST TECHNIQUE: Multiplanar, multisequence MR imaging was performed. No intravenous contrast was administered. COMPARISON:  CT scan 04/08/2024 FINDINGS: Examination is somewhat limited by patient motion. Both hips are normally  located. Moderate age advanced degenerative changes bilaterally, left slightly more significant and right. There is cartilage thinning, joint space narrowing, early spurring and subchondral cystic change. Small bilateral hip joint effusions also noted. 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 I do not see any MR findings to suggest femoroacetabular impingement. No over coverage or under coverage of the hip. No stress fracture or AVN involving the right hip. 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 The pubic symphysis and SI joints are intact. No pelvic fractures or bone lesions. No findings for sacroiliitis. The sacral nerve roots appear normal, surrounded by fat in the neural foramen. No mass or mass effect on the sciatic nerve complexes. No findings for. Trochanteric tendinosis or bursitis. The hip and musculature will. No muscle tear myositis or mass. Mild bilateral hamstring tendinopathy but no tear. No significant intrapelvic abnormalities are identified. No inguinal mass or hernia. IMPRESSION: 1. Moderate age advanced bilateral hip joint degenerative changes, left slightly more significant and right. 2. 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 3. No stress fracture or AVN involving the right hip. 4. 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 5. No significant intrapelvic abnormalities. Electronically Signed   By: MYRTIS Stammer M.D.   On: 04/09/2024 15:00   CT HIP RIGHT WO CONTRAST Result Date: 04/08/2024 CLINICAL DATA:  Hip pain, stress fracture suspected. EXAM: CT OF THE RIGHT HIP WITHOUT CONTRAST TECHNIQUE: Multidetector CT imaging of the right hip was performed according to the standard protocol. Multiplanar CT image reconstructions were also generated. RADIATION DOSE REDUCTION: This exam was performed according to the departmental dose-optimization program which includes automated exposure control, adjustment of the mA and/or kV  according to patient size and/or use of iterative reconstruction technique. COMPARISON:  04/07/2024. FINDINGS: Bones/Joint/Cartilage No evidence of acute fracture or dislocation is seen. Degenerative changes are noted in the lower lumbar spine and there are mild degenerative changes at the right hip. No joint effusion is seen. Ligaments Suboptimally assessed by CT. Muscles and Tendons No intramuscular hematoma. Soft tissues No subcutaneous hematoma is seen. Aortic atherosclerosis. Diverticulosis without diverticulitis. Small fat containing umbilical hernia. No free fluid in the pelvis. IMPRESSION: 1. No acute fracture or dislocation at the right hip. If  clinical concern for stress fracture persists, MRI is recommended. 2. Degenerative changes at the right hip and lower lumbar spine. 3. Aortic atherosclerosis. Electronically Signed   By: Leita Birmingham M.D.   On: 04/08/2024 16:03    Review of Systems  HENT:  Negative for ear discharge, ear pain, hearing loss and tinnitus.   Eyes:  Negative for photophobia and pain.  Respiratory:  Negative for cough and shortness of breath.   Cardiovascular:  Negative for chest pain.  Gastrointestinal:  Negative for abdominal pain, nausea and vomiting.  Genitourinary:  Negative for dysuria, flank pain, frequency and urgency.  Musculoskeletal:  Positive for arthralgias (Right hip). Negative for back pain, myalgias and neck pain.  Neurological:  Negative for dizziness and headaches.  Hematological:  Does not bruise/bleed easily.  Psychiatric/Behavioral:  The patient is not nervous/anxious.    Blood pressure 125/78, pulse 70, temperature 98 F (36.7 C), temperature source Oral, resp. rate 19, height 5' 5 (1.651 m), weight 63.3 kg, SpO2 99%. Physical Exam Constitutional:      General: He is not in acute distress.    Appearance: He is well-developed. He is not diaphoretic.  HENT:     Head: Normocephalic and atraumatic.  Eyes:     General: No scleral icterus.        Right eye: No discharge.        Left eye: No discharge.     Conjunctiva/sclera: Conjunctivae normal.  Cardiovascular:     Rate and Rhythm: Normal rate and regular rhythm.  Pulmonary:     Effort: Pulmonary effort is normal. No respiratory distress.  Musculoskeletal:     Cervical back: Normal range of motion.     Comments: RLE No traumatic wounds, ecchymosis, or rash  Mod TTP over greater troch  No knee or ankle effusion  Knee stable to varus/ valgus and anterior/posterior stress  Sens DPN, SPN, TN intact  Motor EHL, ext, flex, evers 5/5  DP 2+, PT 2+, No significant edema  Skin:    General: Skin is warm and dry.  Neurological:     Mental Status: He is alert.  Psychiatric:        Mood and Affect: Mood normal.        Behavior: Behavior normal.     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 Would recommend trial of oral steroids (Prednisone  60mg  every day x 5d) and f/u with Dr. Ernie in next few weeks. He may benefit from intralesional steroid injection but this is not available as an inpatient.    Ozell DOROTHA Ned, PA-C Orthopedic Surgery [phone] 04/10/2024, 12:10 PM     [1] No Known Allergies

## 2024-04-10 NOTE — Progress Notes (Signed)
 Occupational Therapy Treatment Patient Details Name: Jeffrey [name] MRN: [id number] DOB: 20-Oct-1955 Today's Date: 04/10/2024   History of present illness 69 y.o. who presented 04/06/24 to [facility] ED with AMS and fall. Pt with Acute metabolic encephalopathy and Influenza A infection. PMHx:  HIV, chronic hepatitis C, and CKD.   OT comments  Patient received in supine and initially agreeable to self care tasks due to COTA brought patient clean clothes to wear. Patient able to get to EOB without assistance and asked to examine clothing. Patient stated clothes were too big and did not want them. Patient then stood to go to closet to look for phone. COTA instructed patient that his phone was in the bed. Patient stated he did not want to get up and wanted to rest more. After COTA exited room patient's bed alarm began going off. Patient had gotten back out of bed and stated he was looking for his wallet. COTA assisted patient with finding wallet which was in his jacket on his bed. Discharge recommendations continue to be appropriate. Acute OT to continue to follow to address established goals.       If plan is discharge home, recommend the following:  Assist for transportation   Equipment Recommendations  None recommended by OT    Recommendations for Other Services      Precautions / Restrictions Precautions Precautions: Fall Recall of Precautions/Restrictions: Impaired Restrictions Weight Bearing Restrictions Per Provider Order: No       Mobility Bed Mobility Overal bed mobility: Modified Independent             General bed mobility comments: HOB elevated. Increased time.    Transfers Overall transfer level: Needs assistance Equipment used: None Transfers: Sit to/from Stand Sit to Stand: Contact guard assist           General transfer comment: stood at EOB wanting to check closet for wallet and phone, HHA for safety due to recent fall     Balance Overall balance assessment:  History of Falls, Needs assistance Sitting-balance support: Feet supported, No upper extremity supported Sitting balance-Leahy Scale: Fair Sitting balance - Comments: Sat EOB   Standing balance support: Reliant on assistive device for balance, Bilateral upper extremity supported, During functional activity Standing balance-Leahy Scale: Poor Standing balance comment: dependent on support                           ADL either performed or assessed with clinical judgement   ADL Overall ADL's : Needs assistance/impaired                                       General ADL Comments: declined self care after getting OOB and wanted to lie back down    Extremity/Trunk Assessment              Vision       Perception     Praxis     Communication Communication Communication: Impaired Factors Affecting Communication: Hearing impaired   Cognition Arousal: Alert Behavior During Therapy: Restless, Agitated, Lability Cognition: No family/caregiver present to determine baseline             OT - Cognition Comments: Patient agitated over not being able to find phone but was lying on it, also upset he couldn't find his wallet which was in his jacket on his bed  Following commands: Impaired Following commands impaired: Only follows one step commands consistently, Follows one step commands with increased time      Cueing   Cueing Techniques: Verbal cues, Gestural cues  Exercises      Shoulder Instructions       General Comments      Pertinent Vitals/ Pain       Pain Assessment Pain Assessment: Faces Faces Pain Scale: Hurts little more Pain Location: R hip Pain Descriptors / Indicators: Grimacing, Discomfort Pain Intervention(s): Limited activity within patient's tolerance, Monitored during session, Repositioned  Home Living                                          Prior Functioning/Environment               Frequency  Min 2X/week        Progress Toward Goals  OT Goals(current goals can now be found in the care plan section)  Progress towards OT goals: Progressing toward goals  Acute Rehab OT Goals Patient Stated Goal: to discharge from hospital OT Goal Formulation: With patient Time For Goal Achievement: 04/22/24 Potential to Achieve Goals: Good ADL Goals Pt Will Perform Lower Body Dressing: with modified independence;sit to/from stand Pt Will Transfer to Toilet: with modified independence;ambulating Pt Will Perform Toileting - Clothing Manipulation and hygiene: with modified independence;sit to/from stand  Plan      Co-evaluation                 AM-PAC OT 6 Clicks Daily Activity     Outcome Measure   Help from another person eating meals?: None Help from another person taking care of personal grooming?: A Little Help from another person toileting, which includes using toliet, bedpan, or urinal?: A Little Help from another person bathing (including washing, rinsing, drying)?: A Little Help from another person to put on and taking off regular upper body clothing?: A Little Help from another person to put on and taking off regular lower body clothing?: A Little 6 Click Score: 19    End of Session    OT Visit Diagnosis: Unsteadiness on feet (R26.81);Other abnormalities of gait and mobility (R26.89);History of falling (Z91.81)   Activity Tolerance Other (comment) (limited by agitation)   Patient Left in bed;with call bell/phone within reach;with bed alarm set   Nurse Communication Mobility status        Time: 9082-9062 OT Time Calculation (min): 20 min  Charges: OT General Charges $OT Visit: 1 Visit OT Treatments $Self Care/Home Management : 8-22 mins  Dick Laine, OTA Acute Rehabilitation Services  Office [phone]   Jeb LITTIE Laine 04/10/2024, 1:11 PM

## 2024-04-11 DIAGNOSIS — Z87898 Personal history of other specified conditions: Secondary | ICD-10-CM | POA: Diagnosis not present

## 2024-04-11 DIAGNOSIS — G9341 Metabolic encephalopathy: Secondary | ICD-10-CM | POA: Diagnosis not present

## 2024-04-11 LAB — CULTURE, BLOOD (ROUTINE X 2): Culture: NO GROWTH

## 2024-04-11 MED ORDER — QUETIAPINE FUMARATE 25 MG PO TABS
25.0000 mg | ORAL_TABLET | Freq: Every day | ORAL | Status: DC
  Administered 2024-04-11: 25 mg via ORAL

## 2024-04-11 MED ORDER — QUETIAPINE FUMARATE 25 MG PO TABS
25.0000 mg | ORAL_TABLET | Freq: Two times a day (BID) | ORAL | Status: DC
  Administered 2024-04-11 – 2024-04-13 (×4): 25 mg via ORAL
  Filled 2024-04-11 (×5): qty 1

## 2024-04-11 MED ORDER — QUETIAPINE FUMARATE 50 MG PO TABS
50.0000 mg | ORAL_TABLET | Freq: Every day | ORAL | Status: DC
  Administered 2024-04-11 – 2024-04-12 (×2): 50 mg via ORAL
  Filled 2024-04-11 (×2): qty 1

## 2024-04-11 NOTE — Plan of Care (Signed)

## 2024-04-11 NOTE — Care Management Important Message (Signed)
 Important Message  Patient Details  Name: Jeffrey [name] MRN: [id number] Date of Birth: 09/11/1955   Important Message Given:  Yes - Medicare IM     Claretta Deed 04/11/2024, 10:18 AM

## 2024-04-11 NOTE — Progress Notes (Signed)
 Physical Therapy Treatment Patient Details Name: Jeffrey [name] MRN: [id number] DOB: Jan 18, 1956 Today's Date: 04/11/2024   History of Present Illness 69 y.o. who presented 04/06/24 to [facility] ED with AMS and fall. Pt with Acute metabolic encephalopathy and Influenza A infection. PMHx:  HIV, chronic hepatitis C, and CKD.    PT Comments  Pt received in supine and agreeable to session. Pt demonstrates impaired cognition and safety awareness throughout session requiring CGA and cues. Pt able to increase gait distance this session, but demonstrates RLE buckling x2 requiring assist and pt declines seated rest break despite cues. Noted pt's rollator is missing the knob for the R handle, CM notified. Education provided on reducing fall risk and activity progression, but pt seems resistant to education with poor carryover. Pt continues to benefit from PT services to progress toward functional mobility goals.    If plan is discharge home, recommend the following: A little help with walking and/or transfers;A little help with bathing/dressing/bathroom;Assist for transportation;Help with stairs or ramp for entrance   Can travel by private vehicle        Equipment Recommendations   (Pt reportedly has a rollator and SPC)    Recommendations for Other Services       Precautions / Restrictions Precautions Precautions: Fall Recall of Precautions/Restrictions: Impaired Restrictions Weight Bearing Restrictions Per Provider Order: No     Mobility  Bed Mobility Overal bed mobility: Modified Independent             General bed mobility comments: from flat bed    Transfers Overall transfer level: Needs assistance Equipment used: Rollator (4 wheels) Transfers: Sit to/from Stand Sit to Stand: Contact guard assist           General transfer comment: increased time and effort for rise. Cues for locking rollator brakes and safe hand placement, but pt continuing to hold rollator with brakes unlocked     Ambulation/Gait Ambulation/Gait assistance: Contact guard assist, Min assist Gait Distance (Feet): 75 Feet (+175) Assistive device: Rollator (4 wheels) Gait Pattern/deviations: Trunk flexed, Step-through pattern, Decreased stride length Gait velocity: decr     General Gait Details: Pt demonstrates short steps with low foot clearance despite frequent cues. raised rollator handles with improved upright posture. pt's RLE buckled x2 requiring assist for balance, but pt declined seated rest despite cues and encouragement.   Stairs             Wheelchair Mobility     Tilt Bed    Modified Rankin (Stroke Patients Only)       Balance Overall balance assessment: History of Falls, Needs assistance Sitting-balance support: Feet supported, No upper extremity supported Sitting balance-Leahy Scale: Good Sitting balance - Comments: EOB, pt able to reach down to don shoes   Standing balance support: Reliant on assistive device for balance, Bilateral upper extremity supported, During functional activity Standing balance-Leahy Scale: Poor Standing balance comment: with rollator support and intermittent min A for balance                            Communication Communication Communication: Impaired Factors Affecting Communication: Hearing impaired (hears better out of R ear)  Cognition Arousal: Alert Behavior During Therapy: Restless, Agitated, Lability   PT - Cognitive impairments: No family/caregiver present to determine baseline, Problem solving, Safety/Judgement, Awareness, Attention, Initiation, Sequencing                       PT -  Cognition Comments: Pt providing different information during session, often contradicting himself. Pt with decreased safety awareness despite education and perseverates intermittently Following commands: Impaired Following commands impaired: Only follows one step commands consistently, Follows one step commands with increased  time    Cueing Cueing Techniques: Verbal cues, Gestural cues  Exercises      General Comments        Pertinent Vitals/Pain Pain Assessment Pain Assessment: 0-10 Pain Score: 10-Worst pain ever Pain Location: R hip Pain Descriptors / Indicators: Grimacing, Discomfort Pain Intervention(s): Limited activity within patient's tolerance, Monitored during session, Repositioned     PT Goals (current goals can now be found in the care plan section) Acute Rehab PT Goals Patient Stated Goal: Leave asap PT Goal Formulation: With patient Time For Goal Achievement: 04/23/24 Progress towards PT goals: Progressing toward goals    Frequency    Min 2X/week       AM-PAC PT 6 Clicks Mobility   Outcome Measure  Help needed turning from your back to your side while in a flat bed without using bedrails?: None Help needed moving from lying on your back to sitting on the side of a flat bed without using bedrails?: None Help needed moving to and from a bed to a chair (including a wheelchair)?: A Little Help needed standing up from a chair using your arms (e.g., wheelchair or bedside chair)?: A Little Help needed to walk in hospital room?: A Little Help needed climbing 3-5 steps with a railing? : A Lot 6 Click Score: 19    End of Session Equipment Utilized During Treatment: Gait belt Activity Tolerance: Patient tolerated treatment well Patient left: in bed;with call bell/phone within reach;with bed alarm set Nurse Communication: Mobility status PT Visit Diagnosis: History of falling (Z91.81);Other abnormalities of gait and mobility (R26.89);Difficulty in walking, not elsewhere classified (R26.2);Unsteadiness on feet (R26.81)     Time: 9147-9067 PT Time Calculation (min) (ACUTE ONLY): 40 min  Charges:    $Gait Training: 23-37 mins $Therapeutic Activity: 8-22 mins PT General Charges $$ ACUTE PT VISIT: 1 Visit                    Darryle George, PTA Acute Rehabilitation  Services Secure Chat Preferred  [phone]    Darryle George 04/11/2024, 10:43 AM

## 2024-04-11 NOTE — Progress Notes (Signed)
 Pt refused heparin  sq. MD notified.   Amado GORMAN Arabia, RN

## 2024-04-11 NOTE — Consult Note (Signed)
 [facility] Psychiatric Consult Initial  Patient Name: .Jeffrey [name]  MRN: [id number]  DOB: 1955-10-16  Consult Order details:  Orders (From admission, onward)     Start     Ordered   04/08/24 0743  IP CONSULT TO PSYCHIATRY       Ordering Provider: Dennise Lavada POUR, MD  Provider:  (Not yet assigned)  Question Answer Comment  Location [facility]   Reason for Consult? Aggressive behaviour, placed under IVC in the ER 2 days ago, now wants to leaved the hospital - ? any danger to self or rothers, is it OK to DC IVC. Encephalopathy is much better but still aggressive      04/08/24 0743             Mode of Visit: In person    Psychiatry Consult Evaluation  Service Date: April 11, 2024 LOS:  LOS: 4 days  Chief Complaint 69 year old male who is on IVC demanding to leave.  Consult was to assess the need to continue the IVC.  Primary Psychiatric Diagnoses  Recent history of delirium. 2.  No past psych history on record 3.  Denies depression and substance use disorder.  Assessment  Jeffrey [name] is a 69 y.o. male admitted: [facility] 04/06/2024  8:56 PM for altered mental status. He carries the psychiatric diagnoses of delirium and has a past medical history of as documented.   The patient was IVC in the ED, apparently he had a history of right hip pain and claims that he had bone cancer.  He was seen with a high fever and delirium in the ED and was IVC initially because he was demanding to leave.  However, he has no prior psychiatric history.  He has no drugs in his UDS.  When he was seen today around 10 AM, he was extremely irritable and was upset because he could not find his roller walker which he claims that he brought to the hospital.  His primary concern was that he uses that for ambulation.  He claims that he is on disability and he stays with [employer]. When seen today he was alert, oriented to time place and person.  His speech was rapid loud mildly  pressured and irritable because he was being detained in the hospital.  He denies any history of alcohol or substance use disorder and denies any SI/HI/AVH.  Currently he does not have any imminent risk to self or others other than his extreme irritability and anger.  We recommend releasing him from the IVC.  Discharge is at the discretion of the attending if he is medically stable. TOC consult recommended for outpatient follow-up.  04/11/2024: The patient was released from IVC because he was being discharged on 04/08/2024.  However he apparently refused to leave the hospital because of his hip pain.  Discharge was rescinded and patient was continued to be treated.  He seemed to have responded to Seroquel  but still remains irritable and remains inconsistent in his presentation.  He has been periodically refusing medications.  A chart review indicates no prior psychiatric history.  Collateral from his sister on admission also supported no prior psych history. He was released from IVC but given his lack of insight, comprehension and behavior, he certainly lacks capacity.  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  ## Medical Decision Making Capacity: Patient currently lacks capacity to make decisions about his discharge and hopefully this will improve once his sensorium clears.  ## Further Work-up:  -- As per the hospitalist EKG - -- Pertinent labwork reviewed earlier this admission includes: As per the hospitalist   ## Disposition:-- There are no psychiatric contraindications to discharge at this time  ## Behavioral / Environmental: -Difficult Patient (SELECT OPTIONS FROM BELOW) or Utilize  compassion and acknowledge the patient's experiences while setting clear and realistic expectations for care.    ## Safety and Observation Level:  - Based on my clinical evaluation, I estimate the patient to be at Low risk of self harm in the current setting. - At this time, we recommend  routine. This decision is based on my review of the chart including patient's history and current presentation, interview of the patient, mental status examination, and consideration of suicide risk including evaluating suicidal ideation, plan, intent, suicidal or self-harm behaviors, risk factors, and protective factors. This judgment is based on our ability to directly address suicide risk, implement suicide prevention strategies, and develop a safety plan while the patient is in the clinical setting. Please contact our team if there is a concern that risk level has changed.  CSSR Risk Category:   Suicide Risk Assessment: Patient has following modifiable risk factors for suicide: None which we are addressing by outpatient appointments and medical care. Patient has following non-modifiable or demographic risk factors for suicide: male gender Patient has the following protective factors against suicide: Cultural, spiritual, or religious beliefs that discourage suicide and no history of suicide attempts  Thank you for this consult request. Recommendations have been communicated to the primary team.  We will sign off at this time.   PAULETTE BEETS, MD       History of Present Illness  Relevant Aspects of [facility] Course:  Admitted on 04/06/2024 for altered mental status.   Patient Report:  The patient is a 69 year old male with no prior psychiatric history and no prior substance use history was admitted through the ED with a delirium and extreme irritability.  Apparently lives at the [facility].  At the shelter he was noted to be confused with altered mental status and because he  was irritable and labile and was refusing care.  Once he was admitted the patient was apparently stabilized and per the hospitalist patient was stable with no delirium.  When the patient was assessed he was very lucid and alert, oriented and irritable but knew he was in the hospital and denied any prior history of depression anxiety or psychosis and denied any active SI/HI/AVH. His main anger was because he is missing his walker but he denies any other psychiatric symptoms. Given the lack of criteria for IVC, he is being released from the IVC.  04/09/2024: The patient was not discharged yesterday at his request.  He was just seen today as a follow-up given the fact that he was recently started on Seroquel .  When seen today patient was lying in bed and seemed mildly tremulous.  He was looking around on the floor and claims that he was searching for 2 tablets that he is accidentally dropped on the floor.  Apparently the nurse had given him 2 tablets to take.  When I asked him what  tablets he became extremely irritable angry and labile.  The tablets were still on the table in a cup but he seemed to have trouble identifying them.  He still denies any active SI/HI/AVH and does not appear to be hallucinating.  He currently does not meet the criteria for an IVC but clearly lacks capacity today to make his own decisions.  04/11/2024: The patient was seen and evaluated today.  He is laying in bed and appears to be alert oriented but hypervigilant.  His speech is of low volume and significant for irritability and demanding.  He claims that he has no psychiatric problems and he denies any SI/HI/AVH.  He denies depression denies psychosis denies history of mania and denies any substance use disorder.  Hopefully his mental status will clear after stabilization of his encephalopathy.  Will be continued to be hypervigilant and addressed any questions about capacity or need for IVC.  Psych ROS:  Depression:  Denies Anxiety: Denies Mania (lifetime and current): Denies Psychosis: (lifetime and current): Denies  Collateral information:  Patient is homeless and has only sister's emergency contact.  Contacted Monica Wims at [phone] and left a voicemail.  The sister called back reporting that he never had any prior mental health issues and she had talked to him yesterday and he seemed to be his usual self.  Review of Systems  Psychiatric/Behavioral:  The patient is nervous/anxious.      Psychiatric and Social History  Psychiatric History:  Information collected from patient and medical records.  Patient denies any prior psychiatric history or substance use history.   Social History:  Patient is single has 2 children who are currently not in contact with him.  He reports that he has never had any prior Access to weapons/lethal means: Unknown  Substance History Denies any substance abuse history  Exam Findings  Physical Exam: As per the hospitalist Vital Signs:  Temp:  [97.9 F (36.6 C)-98.2 F (36.8 C)] 98 F (36.7 C) (01/06 1200) Pulse Rate:  [60-92] 92 (01/06 1200) Resp:  [11-15] 15 (01/06 1200) BP: (100-120)/(62-76) 109/62 (01/06 1200) SpO2:  [97 %-100 %] 99 % (01/06 1200) Weight:  [81.8 kg] 81.8 kg (01/06 0431) Blood pressure 109/62, pulse 92, temperature 98 F (36.7 C), temperature source Oral, resp. rate 15, height 5' 5 (1.651 m), weight 81.8 kg, SpO2 99%. Body mass index is 30.01 kg/m.  Physical Exam  Mental Status Exam: General Appearance: Disheveled  Orientation:  Full (Time, Place, and Person)  Memory:  Immediate;   Fair Recent;   Fair Remote;   Fair  Concentration:  Concentration: Fair and Attention Span: Fair  Recall:  Fair  Attention  Poor  Eye Contact:  Good  Speech:  Normal Rate  Language:  Fair  Volume:  Increased  Mood: Irritable  Affect:  Full Range  Thought Process:  Linear  Thought Content:  Rumination  Suicidal Thoughts:  No  Homicidal  Thoughts:  No  Judgement:  Fair  Insight:  Shallow  Psychomotor Activity:  Restlessness  Akathisia:  NA  Fund of Knowledge:  Fair      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  Family History:  The patient's family history includes Cancer in his mother and sister.  Medical History: Past Medical History:  Diagnosis Date   Cataract    Glaucoma    Hearing deficit    Hepatitis C    Renal disorder     Surgical History: History reviewed. No pertinent surgical history.   Medications:  Current Medications[1]  Allergies: Allergies[2]  PAULETTE BEETS, MD       [1]  Current Facility-Administered Medications:    acetaminophen  (TYLENOL ) tablet 650 mg, 650 mg, Oral, Q6H PRN, 650 mg at 04/10/24 1937 **OR** acetaminophen  (TYLENOL ) suppository 650 mg, 650 mg, Rectal, Q6H PRN, Howerter, Justin B, DO   benzonatate  (TESSALON ) capsule 200 mg, 200 mg, Oral, TID PRN, Howerter, Justin B, DO   diclofenac  Sodium (VOLTAREN ) 1 % topical gel 2 g, 2 g, Topical, QID, Singh, Prashant K, MD, 2 g at 04/11/24 1005   guaiFENesin  (MUCINEX ) 12 hr tablet 600 mg, 600 mg, Oral, BID, Howerter, Justin B, DO, 600 mg at 04/11/24 1004   haloperidol  lactate (HALDOL ) injection 5 mg, 5 mg, Intravenous, Once PRN, Howerter, Justin B, DO   heparin  injection 5,000 Units, 5,000 Units, Subcutaneous, Q12H, Rosario Eland I, MD, 5,000 Units at 04/07/24 2211   melatonin tablet 3 mg, 3 mg, Oral, QHS PRN, Howerter, Justin B, DO, 3 mg at 04/10/24 1937   menthol  (CEPACOL) lozenge 3 mg, 1 lozenge, Oral, PRN, Howerter, Justin B, DO   methylPREDNISolone  sodium succinate (SOLU-MEDROL ) 40 mg/mL injection 40 mg, 40 mg, Intravenous, Q24H, Singh, Prashant K, MD, 40 mg at 04/10/24 1707   metoprolol  tartrate (LOPRESSOR )  injection 2.5 mg, 2.5 mg, Intravenous, Q5 min PRN, Sundil, Subrina, MD   ondansetron  (ZOFRAN ) injection 4 mg, 4 mg, Intravenous, Q6H PRN, Howerter, Justin B, DO   QUEtiapine  (SEROQUEL ) tablet 25 mg, 25 mg, Oral, Daily, Powell, Lisa K, RPH, 25 mg at 04/11/24 1004   QUEtiapine  (SEROQUEL ) tablet 50 mg, 50 mg, Oral, QHS, Perri Olam POUR, RPH   traMADol  (ULTRAM ) tablet 50 mg, 50 mg, Oral, Q6H PRN, Singh, Prashant K, MD, 50 mg at 04/10/24 2206 [2] No Known Allergies

## 2024-04-11 NOTE — TOC Progression Note (Signed)
 Transition of Care [facility]) - Progression Note    Patient Details  Name: Jeffrey [name] MRN: [id number] Date of Birth: 1955/05/09  Transition of Care [facility]) CM/SW Contact  [name], RN Phone Number: 04/11/2024, 1:40 PM  Clinical Narrative:     CM discussed dispo options with patient due to him not wanting to return to [facility] at d/c. After discussion, patient is in agreement to return to [facility], and will try to arrange alternate living facilities from that point.   Patient reporting the rollator in his room is not his. CM did contact [employer], and they report they have his rollator stored for him when he comes back.   Potential d/c plan for tomorrow.   CM will continue to follow.   Expected Discharge Plan: Homeless Shelter Barriers to Discharge: Continued Medical Work up               Expected Discharge Plan and Services In-house Referral: Clinical Social Work Discharge Planning Services: CM Consult   Living arrangements for the past 2 months: Homeless Shelter Expected Discharge Date: 04/08/24                                     Social Drivers of Health (SDOH) Interventions SDOH Screenings   Food Insecurity: Food Insecurity Present (04/08/2024)  Housing: Unknown (04/09/2024)  Recent Concern: Housing - High Risk (04/08/2024)  Transportation Needs: Unmet Transportation Needs (04/09/2024)  Utilities: At Risk (04/09/2024)  Depression (PHQ2-9): Low Risk (07/07/2023)  Recent Concern: Depression (PHQ2-9) - Medium Risk (06/17/2023)  Social Connections: Patient Declined (04/10/2024)  Tobacco Use: High Risk (04/07/2024)    Readmission Risk Interventions     No data to display

## 2024-04-11 NOTE — Progress Notes (Signed)
 "                                                          [facility]                                                                                                                                                                         Patient Demographics  Jeffrey [name], is a 69 y.o. male  RDW:[id number]  FMW:[id number]  DOB - May 24, 1955  Admit date - 04/06/2024  Admitting Physician [name], DO  Outpatient Primary MD for the patient is [name], MD  LOS - 4   Chief Complaint  Patient presents with   Medical Clearance      Note patient was discharged on 04/08/2024 upon his request, post discharge he refused to leave and wanted to stay another day as his 53-year-old right hip pain was getting worse and now does not want to leave to a shelter where he came from.   69 y.o. male with medical history significant for chronic hepatitis C, CKD 3B with baseline creatinine 1.5-1.9, who is admitted to [facility] on 04/06/2024 with acute metabolic encephalopathy in the setting of influenza A infection after presenting from homeless shelter to [facility] ED for evaluation of altered mental status.    The patient, who has been residing at homeless shelter, was sent over after staff at shelter noted the patient to be confused, altered, agitated relative to his baseline mental status.  In the ER he was diagnosed with influenza A infection, metabolic encephalopathy, placed under IVC by the ER physician and admitted to the hospital for influenza A infection treatment and encephalopathy management.   Assessment & Plan   Acute metabolic encephalopathy.  in a patient who lives in a homeless shelter due to influenza A infection and ensuing inflammation, CT head -ve, no headache, no focal deficits, he is now alert awake oriented x 3, initially was under IVC in the ER now IVC has been cleared by psych on 04/08/2024, continue treatment for influenza A as below.   Influenza A  infection.  5 days of Tamiflu , currently symptom-free no cough or shortness of breath.  Counseled to be compliant with medication.  Sepsis at the time of admission has resolved.   Extreme agitation and aggressive behavior.  Has been very aggressive towards the staff, PT on 04/08/2024, nurse Aureliano on 04/08/2024, this MD, refusing labs and testing procedures and not participating with care plan.  Discussed with psych and placed on Seroquel .  History of HIV, history of chronic hepatitis C.  Continue home medications. ? compliance,   Follow-up with PCP & ID in the outpatient setting.   CKD stage IIIb.  Baseline creatinine close to 1.6.  At baseline.  Refused labs this morning as he does not want to be poked or disturbed.  Chronic right hip pain x 2 years.  According to the patient he had fallen 2 years ago and since then his right hip pain has been hurting, it is getting worse with time according to him, CT and MRI of right hip both nonacute, chronic arthritis/bursitis likely per orthopedics, short course of steroids thereafter follow-up with Dr. Adina Car orthopedics in the outpatient setting.  Homeless situation.  Patient lives in a shelter, having difficulties ambulating by himself due to overall deconditioning and chronic right hip discomfort.  PT OT and monitor.  Now wants to stay here and does not want to leave till he gets better.   Medications  Scheduled Meds:  diclofenac  Sodium  2 g Topical QID   guaiFENesin   600 mg Oral BID   heparin  injection (subcutaneous)  5,000 Units Subcutaneous Q12H   methylPREDNISolone  (SOLU-MEDROL ) injection  40 mg Intravenous Q24H   QUEtiapine   25 mg Oral Daily   QUEtiapine   50 mg Oral QHS   Continuous Infusions: PRN Meds:.acetaminophen  **OR** acetaminophen , benzonatate , haloperidol  lactate, melatonin, menthol , metoprolol  tartrate, ondansetron  (ZOFRAN ) IV, traMADol     Time Spent in minutes   25  minutes   Lavada Stank M.D on 04/11/2024 at 10:15 AM  Between  7am to 7pm - Pager - [phone]  After 7pm go to [email or web address] - password [email or web address]  And look for the night coverage person covering for me after hours  [facility] Office  [phone]    Subjective:   In bed denies any headache chest or abdominal pain, chronic right-sided hip pain upon ambulation, does not want to talk or be disturbed,  Objective:   Vitals:   04/11/24 0300 04/11/24 0400 04/11/24 0431 04/11/24 0800  BP: 104/74 100/68  120/76  Pulse: 60   90  Resp: 11     Temp:    98.2 F (36.8 C)  TempSrc:    Oral  SpO2: 100%   97%  Weight:   81.8 kg   Height:        Wt Readings from Last 3 Encounters:  04/11/24 81.8 kg  12/16/23 63.5 kg  11/28/23 63.5 kg     Intake/Output Summary (Last 24 hours) at 04/11/2024 1015 Last data filed at 04/10/2024 1900 Gross per 24 hour  Intake --  Output 300 ml  Net -300 ml    Exam  Awake but gets increasingly angry after a few questions, no new F.N deficits, supple neck, denies any headache Grant.AT,PERRAL Supple Neck,  Symmetrical Chest wall movement, Good air movement bilaterally, CTAB RRR,No Gallops, Rubs or new Murmurs,  +ve B.Sounds, Abd Soft, No tenderness,   No Cyanosis, Clubbing or edema   Data Review    Data Review:   Patient Lines/Drains/Airways Status     Active Line/Drains/Airways     Name Placement date Placement time Site Days   Peripheral IV 04/07/24 20 G Anterior;Left Forearm 04/07/24  --  Forearm  4             Inpatient Medications  Scheduled Meds:  diclofenac  Sodium  2 g Topical QID   guaiFENesin   600 mg Oral BID   heparin  injection (subcutaneous)  5,000 Units Subcutaneous Q12H  methylPREDNISolone  (SOLU-MEDROL ) injection  40 mg Intravenous Q24H   QUEtiapine   25 mg Oral Daily   QUEtiapine   50 mg Oral QHS   Continuous Infusions: PRN Meds:.acetaminophen  **OR** acetaminophen , benzonatate , haloperidol  lactate, melatonin, menthol , metoprolol  tartrate, ondansetron  (ZOFRAN ) IV,  traMADol   DVT Prophylaxis  heparin  injection 5,000 Units Start: 04/07/24 2200 SCDs Start: 04/07/24 0206       Recent Labs  Lab 04/06/24 2245 04/06/24 2338 04/07/24 0422 04/08/24 1858  WBC 5.0  --  3.8* 3.0*  HGB 12.2* 12.6* 11.7* 12.0*  HCT 36.5* 37.0* 35.0* 35.5*  PLT 142*  --  113* 130*  MCV 99.2  --  98.6 97.0  MCH 33.2  --  33.0 32.8  MCHC 33.4  --  33.4 33.8  RDW 13.5  --  13.4 13.2  LYMPHSABS 0.5*  --  0.8 1.6  MONOABS 0.7  --  0.9 0.4  EOSABS 0.0  --  0.0 0.1  BASOSABS 0.0  --  0.0 0.0    Recent Labs  Lab 04/06/24 2245 04/06/24 2338 04/08/24 1858  NA 133* 135 136  K 4.7 4.4 3.7  CL 100 103 103  CO2 20*  --  20*  ANIONGAP 13  --  12  GLUCOSE 100* 99 105*  BUN 24* 29* 29*  CREATININE 1.63* 1.60* 1.59*  AST 58*  --  50*  ALT 40  --  30  ALKPHOS 102  --  87  BILITOT 0.3  --  0.2  ALBUMIN 4.3  --  3.6  LATICACIDVEN  --  0.9  --   INR 1.0  --   --   AMMONIA  --   --  33  MG  --   --  2.2  CALCIUM  9.3  --  8.4*      Recent Labs  Lab 04/06/24 2245 04/06/24 2338 04/08/24 1858  LATICACIDVEN  --  0.9  --   INR 1.0  --   --   AMMONIA  --   --  33  MG  --   --  2.2  CALCIUM  9.3  --  8.4*    --------------------------------------------------------------------------------------------------------------- Lab Results  Component Value Date   CHOL 113 06/17/2023   HDL 44 06/17/2023   LDLCALC 48 06/17/2023   TRIG 117 06/17/2023   CHOLHDL 2.6 06/17/2023    No results found for: HGBA1C No results for input(s): TSH, T4TOTAL, FREET4, T3FREE, THYROIDAB in the last 72 hours. No results for input(s): VITAMINB12, FOLATE, FERRITIN, TIBC, IRON, RETICCTPCT in the last 72 hours. ------------------------------------------------------------------------------------------------------------------ Cardiac Enzymes No results for input(s): CKMB, TROPONINI, MYOGLOBIN in the last 168 hours.  Invalid input(s): CK  Micro  Results Recent Results (from the past 240 hours)  Blood Culture (routine x 2)     Status: None   Collection Time: 04/06/24  9:28 PM   Specimen: BLOOD LEFT FOREARM  Result Value Ref Range Status   Specimen Description BLOOD LEFT FOREARM  Final   Special Requests   Final    BOTTLES DRAWN AEROBIC AND ANAEROBIC Blood Culture results may not be optimal due to an inadequate volume of blood received in culture bottles   Culture   Final    NO GROWTH 5 DAYS Performed at [facility], [address], [place], [place] [address]    Report Status 04/11/2024 FINAL  Final  Resp panel by RT-PCR (RSV, Flu A&B, Covid) Anterior Nasal Swab     Status: Abnormal   Collection Time: 04/06/24  9:28 PM  Specimen: Anterior Nasal Swab  Result Value Ref Range Status   SARS Coronavirus 2 by RT PCR NEGATIVE NEGATIVE Final   Influenza A by PCR POSITIVE (A) NEGATIVE Final   Influenza B by PCR NEGATIVE NEGATIVE Final    Comment: (NOTE) The Xpert Xpress SARS-CoV-2/FLU/RSV plus assay is intended as an aid in the diagnosis of influenza from Nasopharyngeal swab specimens and should not be used as a sole basis for treatment. Nasal washings and aspirates are unacceptable for Xpert Xpress SARS-CoV-2/FLU/RSV testing.  Fact Sheet for Patients: bloggercourse.com  Fact Sheet for Healthcare Providers: seriousbroker.it  This test is not yet approved or cleared by the [place]  FDA and has been authorized for detection and/or diagnosis of SARS-CoV-2 by FDA under an Emergency Use Authorization (EUA). This EUA will remain in effect (meaning this test can be used) for the duration of the COVID-19 declaration under Section 564(b)(1) of the Act, 21 U.S.C. section 360bbb-3(b)(1), unless the authorization is terminated or revoked.     Resp Syncytial Virus by PCR NEGATIVE NEGATIVE Final    Comment: (NOTE) Fact Sheet for  Patients: bloggercourse.com  Fact Sheet for Healthcare Providers: seriousbroker.it  This test is not yet approved or cleared by the [place]  FDA and has been authorized for detection and/or diagnosis of SARS-CoV-2 by FDA under an Emergency Use Authorization (EUA). This EUA will remain in effect (meaning this test can be used) for the duration of the COVID-19 declaration under Section 564(b)(1) of the Act, 21 U.S.C. section 360bbb-3(b)(1), unless the authorization is terminated or revoked.  Performed at [facility], [address], [place], [place] [address]     Radiology Reports  No results found.    Signature  -   Lavada Stank M.D on 04/11/2024 at 10:16 AM   -  To page go to [email or web address]          "

## 2024-04-12 DIAGNOSIS — G9341 Metabolic encephalopathy: Secondary | ICD-10-CM | POA: Diagnosis not present

## 2024-04-12 LAB — CBC WITH DIFFERENTIAL/PLATELET
Abs Immature Granulocytes: 0.07 K/uL (ref 0.00–0.07)
Basophils Absolute: 0 K/uL (ref 0.0–0.1)
Basophils Relative: 0 %
Eosinophils Absolute: 0 K/uL (ref 0.0–0.5)
Eosinophils Relative: 0 %
HCT: 34.3 % — ABNORMAL LOW (ref 39.0–52.0)
Hemoglobin: 11.4 g/dL — ABNORMAL LOW (ref 13.0–17.0)
Immature Granulocytes: 1 %
Lymphocytes Relative: 8 %
Lymphs Abs: 0.8 K/uL (ref 0.7–4.0)
MCH: 32.7 pg (ref 26.0–34.0)
MCHC: 33.2 g/dL (ref 30.0–36.0)
MCV: 98.3 fL (ref 80.0–100.0)
Monocytes Absolute: 0.5 K/uL (ref 0.1–1.0)
Monocytes Relative: 5 %
Neutro Abs: 8.9 K/uL — ABNORMAL HIGH (ref 1.7–7.7)
Neutrophils Relative %: 86 %
Platelets: 133 K/uL — ABNORMAL LOW (ref 150–400)
RBC: 3.49 MIL/uL — ABNORMAL LOW (ref 4.22–5.81)
RDW: 13 % (ref 11.5–15.5)
WBC: 10.3 K/uL (ref 4.0–10.5)
nRBC: 0 % (ref 0.0–0.2)

## 2024-04-12 LAB — BASIC METABOLIC PANEL WITH GFR
Anion gap: 9 (ref 5–15)
BUN: 43 mg/dL — ABNORMAL HIGH (ref 8–23)
CO2: 21 mmol/L — ABNORMAL LOW (ref 22–32)
Calcium: 8.6 mg/dL — ABNORMAL LOW (ref 8.9–10.3)
Chloride: 105 mmol/L (ref 98–111)
Creatinine, Ser: 1.94 mg/dL — ABNORMAL HIGH (ref 0.61–1.24)
GFR, Estimated: 37 mL/min — ABNORMAL LOW
Glucose, Bld: 141 mg/dL — ABNORMAL HIGH (ref 70–99)
Potassium: 5 mmol/L (ref 3.5–5.1)
Sodium: 135 mmol/L (ref 135–145)

## 2024-04-12 LAB — MAGNESIUM: Magnesium: 2.8 mg/dL — ABNORMAL HIGH (ref 1.7–2.4)

## 2024-04-12 MED ORDER — SODIUM CHLORIDE 0.9 % IV SOLN: INTRAVENOUS | Status: AC

## 2024-04-12 NOTE — TOC Progression Note (Signed)
 Transition of Care [facility]) - Progression Note    Patient Details  Name: Jeffrey [name] MRN: [id number] Date of Birth: 04/17/1955  Transition of Care [facility]) CM/SW Contact  Jeffrey DELENA Senters, RN Phone Number: 04/12/2024, 4:22 PM  Clinical Narrative:     CM received msg from [facility] doc that patient may not be able to return to [place] and requested CM call [facility] CM for further info. CM called L[facility] [phone] and left HIPAA compliant VM for callback.  Continued medical workup.  CM will continue to follow.   Expected Discharge Plan: Homeless Shelter Barriers to Discharge: Continued Medical Work up               Expected Discharge Plan and Services In-house Referral: Clinical Social Work Discharge Planning Services: CM Consult   Living arrangements for the past 2 months: Homeless Shelter Expected Discharge Date: 04/08/24                                     Social Drivers of Health (SDOH) Interventions SDOH Screenings   Food Insecurity: Food Insecurity Present (04/08/2024)  Housing: Unknown (04/09/2024)  Recent Concern: Housing - High Risk (04/08/2024)  Transportation Needs: Unmet Transportation Needs (04/09/2024)  Utilities: At Risk (04/09/2024)  Depression (PHQ2-9): Low Risk (07/07/2023)  Recent Concern: Depression (PHQ2-9) - Medium Risk (06/17/2023)  Social Connections: Patient Declined (04/10/2024)  Tobacco Use: High Risk (04/07/2024)    Readmission Risk Interventions     No data to display

## 2024-04-12 NOTE — Progress Notes (Addendum)
 I am the patients PCP and have been following the patient's progress.  Apparently the patient has been exited from the shelter due to not being able to walk with falls  [employer] can provide him rooming house lists to go to, they will give this to the patient and CM and pt said he would go and there are openings.  Furthermore I have him down to see me at our new community care clinic opening across from [facility]  Jan 29 called [facility].  His appt on Jan 29 will be at 11:10 AM  We will ensure he has transportation to the clinic  We will ensure he has f/u with orthopedics  We will ensure he continues his seroquel   Please provide a transition medication pharmacy fill on all new meds.  Call me with any questions.  Belvie Bouillon Medical Director  [facility] [facility] Sponsored by [facility]

## 2024-04-12 NOTE — Progress Notes (Signed)
 "                                                          [facility]                                                                                                                                                                         Patient Demographics  Jeffrey [name], is a 69 y.o. male  RDW:[id number]  FMW:[id number]  DOB - 1955-05-30  Admit date - 04/06/2024  Admitting Physician [name], DO  Outpatient Primary MD for the patient is [name], MD  LOS - 5   Chief Complaint  Patient presents with   Medical Clearance       69 y.o. 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    The patient, who has been residing at homeless shelter, was sent over after staff at shelter noted the patient to be confused, altered, agitated relative to his baseline mental status.  In the ER he was diagnosed with influenza A infection, metabolic encephalopathy, placed under IVC by the ER physician and admitted to the hospital for influenza A infection treatment and encephalopathy management.   Assessment & Plan   Acute metabolic encephalopathy.  in a patient who lives in a homeless shelter due to influenza A infection and ensuing inflammation, CT head -ve, no headache, no focal deficits, he is now alert awake oriented x 3, initially was under IVC in the ER now IVC has been cleared by psych on 04/08/2024, continue treatment for influenza A as below.   Influenza A infection.  5 days of Tamiflu , currently symptom-free no cough or shortness of breath.  Counseled to be compliant with medication.  Sepsis at the time of admission has resolved.   Extreme agitation and aggressive behavior.  Has been very  aggressive towards the staff, PT on 04/08/2024, nurse Aureliano on 04/08/2024, this MD, refusing labs and testing procedures and not participating with care plan.  Discussed with psych and placed on Seroquel .  History of HIV, history of chronic hepatitis C.  Continue home medications. ? compliance,   Follow-up with PCP & ID in the outpatient setting.   AKI on CKD stage IIIb.  - 1.59, up to 1.94 this morning , calcium  slightly up  at 5, . - Avoid hypotension, does not appear to be on any hypertensive agents, he had couple low systolic reading blood pressure 102 and 101  - He was encouraged to increase his fluid intake, will keep on IV normal saline overnight and recheck labs in a.m.  Chronic right hip pain x 2 years.  According to the patient he had fallen 2 years ago and since then his right hip pain has been hurting, it is getting worse with time according to him, CT and MRI of right hip both nonacute, chronic arthritis/bursitis likely per orthopedics, short course of steroids thereafter follow-up with Dr. Adina Car orthopedics in the outpatient setting.  Homeless situation.  Patient lives in a shelter, having difficulties ambulating by himself due to overall deconditioning and chronic right hip discomfort.  PT OT and monitor.  Now wants to stay here and does not want to leave till he gets better.   Medications  Scheduled Meds:  diclofenac  Sodium  2 g Topical QID   guaiFENesin   600 mg Oral BID   heparin  injection (subcutaneous)  5,000 Units Subcutaneous Q12H   methylPREDNISolone  (SOLU-MEDROL ) injection  40 mg Intravenous Q24H   QUEtiapine   25 mg Oral BID WC   QUEtiapine   50 mg Oral QHS   Continuous Infusions:  sodium chloride  100 mL/hr at 04/12/24 0833   PRN Meds:.acetaminophen  **OR** acetaminophen , benzonatate , haloperidol  lactate, melatonin, menthol , metoprolol  tartrate, ondansetron  (ZOFRAN ) IV, traMADol       Brayton Lye M.D on 04/12/2024 at 1:46 PM   And look for the night coverage  person covering for me after hours  [facility] Office  [phone]    Subjective:   In bed denies any headache chest or abdominal pain, chronic right-sided hip pain upon ambulation, does not want to talk or be disturbed,  Objective:   Vitals:   04/12/24 0000 04/12/24 0820 04/12/24 0822 04/12/24 1204  BP: 107/69 123/61  122/70  Pulse: 65 64  66  Resp: 14   19  Temp:   97.9 F (36.6 C) 98.3 F (36.8 C)  TempSrc:   Oral Oral  SpO2: 98% 97%    Weight:      Height:        Wt Readings from Last 3 Encounters:  04/11/24 81.8 kg  12/16/23 63.5 kg  11/28/23 63.5 kg     Intake/Output Summary (Last 24 hours) at 04/12/2024 1346 Last data filed at 04/12/2024 1301 Gross per 24 hour  Intake 180 ml  Output 200 ml  Net -20 ml    Exam  Awake, alert, no apparent distress  Good air entry  Regular rate and rhythm  Abdomen soft  Extremities with no edema   Data Review    Data Review:   Patient Lines/Drains/Airways Status     Active Line/Drains/Airways     Name Placement date Placement time Site Days   Peripheral IV 04/12/24 20 G Anterior;Left Forearm 04/12/24  1300  Forearm  less than 1             Inpatient Medications  Scheduled Meds:  diclofenac  Sodium  2 g Topical QID   guaiFENesin   600 mg Oral BID   heparin  injection (subcutaneous)  5,000 Units Subcutaneous Q12H   methylPREDNISolone  (SOLU-MEDROL ) injection  40 mg Intravenous Q24H   QUEtiapine   25 mg Oral BID WC   QUEtiapine   50 mg Oral QHS   Continuous Infusions:  sodium chloride  100 mL/hr at 04/12/24 0833   PRN Meds:.acetaminophen  **OR** acetaminophen , benzonatate , haloperidol   lactate, melatonin, menthol , metoprolol  tartrate, ondansetron  (ZOFRAN ) IV, traMADol   DVT Prophylaxis  heparin  injection 5,000 Units Start: 04/07/24 2200 SCDs Start: 04/07/24 0206       Recent Labs  Lab 04/06/24 2245 04/06/24 2338 04/07/24 0422 04/08/24 1858 04/12/24 0233  WBC 5.0  --  3.8* 3.0* 10.3   HGB 12.2* 12.6* 11.7* 12.0* 11.4*  HCT 36.5* 37.0* 35.0* 35.5* 34.3*  PLT 142*  --  113* 130* 133*  MCV 99.2  --  98.6 97.0 98.3  MCH 33.2  --  33.0 32.8 32.7  MCHC 33.4  --  33.4 33.8 33.2  RDW 13.5  --  13.4 13.2 13.0  LYMPHSABS 0.5*  --  0.8 1.6 0.8  MONOABS 0.7  --  0.9 0.4 0.5  EOSABS 0.0  --  0.0 0.1 0.0  BASOSABS 0.0  --  0.0 0.0 0.0    Recent Labs  Lab 04/06/24 2245 04/06/24 2338 04/08/24 1858 04/12/24 0233  NA 133* 135 136 135  K 4.7 4.4 3.7 5.0  CL 100 103 103 105  CO2 20*  --  20* 21*  ANIONGAP 13  --  12 9  GLUCOSE 100* 99 105* 141*  BUN 24* 29* 29* 43*  CREATININE 1.63* 1.60* 1.59* 1.94*  AST 58*  --  50*  --   ALT 40  --  30  --   ALKPHOS 102  --  87  --   BILITOT 0.3  --  0.2  --   ALBUMIN 4.3  --  3.6  --   LATICACIDVEN  --  0.9  --   --   INR 1.0  --   --   --   AMMONIA  --   --  33  --   MG  --   --  2.2 2.8*  CALCIUM  9.3  --  8.4* 8.6*      Recent Labs  Lab 04/06/24 2245 04/06/24 2338 04/08/24 1858 04/12/24 0233  LATICACIDVEN  --  0.9  --   --   INR 1.0  --   --   --   AMMONIA  --   --  33  --   MG  --   --  2.2 2.8*  CALCIUM  9.3  --  8.4* 8.6*    --------------------------------------------------------------------------------------------------------------- Lab Results  Component Value Date   CHOL 113 06/17/2023   HDL 44 06/17/2023   LDLCALC 48 06/17/2023   TRIG 117 06/17/2023   CHOLHDL 2.6 06/17/2023    No results found for: HGBA1C No results for input(s): TSH, T4TOTAL, FREET4, T3FREE, THYROIDAB in the last 72 hours. No results for input(s): VITAMINB12, FOLATE, FERRITIN, TIBC, IRON, RETICCTPCT in the last 72 hours. ------------------------------------------------------------------------------------------------------------------ Cardiac Enzymes No results for input(s): CKMB, TROPONINI, MYOGLOBIN in the last 168 hours.  Invalid input(s): CK  Micro Results Recent Results (from the past 240  hours)  Blood Culture (routine x 2)     Status: None   Collection Time: 04/06/24  9:28 PM   Specimen: BLOOD LEFT FOREARM  Result Value Ref Range Status   Specimen Description BLOOD LEFT FOREARM  Final   Special Requests   Final    BOTTLES DRAWN AEROBIC AND ANAEROBIC Blood Culture results may not be optimal due to an inadequate volume of blood received in culture bottles   Culture   Final    NO GROWTH 5 DAYS Performed at [facility], [address], [place], [place] [address]    Report Status 04/11/2024 FINAL  Final  Resp panel by RT-PCR (RSV, Flu A&B, Covid) Anterior Nasal Swab     Status: Abnormal   Collection Time: 04/06/24  9:28 PM   Specimen: Anterior Nasal Swab  Result Value Ref Range Status   SARS Coronavirus 2 by RT PCR NEGATIVE NEGATIVE Final   Influenza A by PCR POSITIVE (A) NEGATIVE Final   Influenza B by PCR NEGATIVE NEGATIVE Final    Comment: (NOTE) The Xpert Xpress SARS-CoV-2/FLU/RSV plus assay is intended as an aid in the diagnosis of influenza from Nasopharyngeal swab specimens and should not be used as a sole basis for treatment. Nasal washings and aspirates are unacceptable for Xpert Xpress SARS-CoV-2/FLU/RSV testing.  Fact Sheet for Patients: bloggercourse.com  Fact Sheet for Healthcare Providers: seriousbroker.it  This test is not yet approved or cleared by the [place]  FDA and has been authorized for detection and/or diagnosis of SARS-CoV-2 by FDA under an Emergency Use Authorization (EUA). This EUA will remain in effect (meaning this test can be used) for the duration of the COVID-19 declaration under Section 564(b)(1) of the Act, 21 U.S.C. section 360bbb-3(b)(1), unless the authorization is terminated or revoked.     Resp Syncytial Virus by PCR NEGATIVE NEGATIVE Final    Comment: (NOTE) Fact Sheet for Patients: bloggercourse.com  Fact Sheet for Healthcare  Providers: seriousbroker.it  This test is not yet approved or cleared by the [place]  FDA and has been authorized for detection and/or diagnosis of SARS-CoV-2 by FDA under an Emergency Use Authorization (EUA). This EUA will remain in effect (meaning this test can be used) for the duration of the COVID-19 declaration under Section 564(b)(1) of the Act, 21 U.S.C. section 360bbb-3(b)(1), unless the authorization is terminated or revoked.  Performed at [facility], [address], [place], [place] [address]     Radiology Reports  No results found.    Signature  -   Brayton Lye M.D on 04/12/2024 at 1:46 PM   -  To page go to [email or web address]          "

## 2024-04-12 NOTE — Progress Notes (Signed)
 OT Cancellation Note  Patient Details Name: Jeffrey [name] MRN: [id number] DOB: 08/04/1955   Cancelled Treatment:    Reason Eval/Treat Not Completed: (P) Patient declined, Pt declined OOB activities, agitated, c/o R hip pain. Will check back tomorrow. Elouise JONELLE Bott 04/12/2024, 2:19 PM

## 2024-04-12 NOTE — Progress Notes (Signed)
 PT Cancellation Note  Patient Details Name: Jeffrey [name] MRN: [id number] DOB: December 22, 1955   Cancelled Treatment:    Pt presents supine in bed asleep and requires increased time to arouse.  Pt adamantly refusing therapy, c/o pain.  Will follow-up as time allows.    Reyes SHAUNNA Sierra 04/12/2024, 10:42 AM

## 2024-04-13 ENCOUNTER — Other Ambulatory Visit (HOSPITAL_COMMUNITY): Payer: Self-pay

## 2024-04-13 DIAGNOSIS — G9341 Metabolic encephalopathy: Secondary | ICD-10-CM | POA: Diagnosis not present

## 2024-04-13 MED ORDER — DICLOFENAC SODIUM 1 % EX GEL
2.0000 g | Freq: Four times a day (QID) | CUTANEOUS | 0 refills | Status: AC
  Filled 2024-04-13 – 2024-04-14 (×2): qty 20, fill #0

## 2024-04-13 MED ORDER — QUETIAPINE FUMARATE 25 MG PO TABS
25.0000 mg | ORAL_TABLET | Freq: Two times a day (BID) | ORAL | 0 refills | Status: AC
  Filled 2024-04-13 – 2024-04-14 (×2): qty 60, 30d supply, fill #0

## 2024-04-13 MED ORDER — ACETAMINOPHEN 325 MG PO TABS: 650.0000 mg | ORAL_TABLET | Freq: Four times a day (QID) | ORAL | Status: AC | PRN

## 2024-04-13 NOTE — TOC Transition Note (Signed)
 Transition of Care [facility]) - Discharge Note   Patient Details  Name: Jeffrey [name] MRN: [id number] Date of Birth: 21-Jul-1955  [facility] CM/SW Contact:  [name], RN Phone Number: 04/13/2024, 4:49 PM   Clinical Narrative:    Patient will be discharging from hospital today. Patient is not able to return to [facility] due to having more needs than they can accommodate. Patient has been deemed as able to make his own decisions by psych and attending physician.   CM did attempt to speak to patient about ALF type facilities and patient refuses to consider any of these options, reporting they will take his check and he will not consider doing anything like that. Patient reports he wants to leave the hospital and go to the streets. He is aware that he is not able to [facility] to the [facility].   Patient's sister, [name], has his Rollator and other belongings from the [facility] and he is aware of this.   Referral was sent to [facility] for OPPT, info on AVS.   No further needs identified by CM.   Final next level of care: OP Rehab Barriers to Discharge: No Barriers Identified   Patient Goals and CMS Choice            Discharge Placement                       Discharge Plan and Services Additional resources added to the After Visit Summary for   In-house Referral: Clinical Social Work Discharge Planning Services: CM Consult                                 Social Drivers of Health (SDOH) Interventions SDOH Screenings   Food Insecurity: Food Insecurity Present (04/08/2024)  Housing: Unknown (04/09/2024)  Recent Concern: Housing - High Risk (04/08/2024)  Transportation Needs: Unmet Transportation Needs (04/09/2024)  Utilities: At Risk (04/09/2024)  Depression (PHQ2-9): Low Risk (07/07/2023)  Recent Concern: Depression (PHQ2-9) - Medium Risk (06/17/2023)  Social Connections: Patient Declined (04/10/2024)  Tobacco Use: High Risk (04/07/2024)      Readmission Risk Interventions     No data to display

## 2024-04-13 NOTE — Discharge Summary (Addendum)
 Physician Discharge Summary  Jeffrey [name] FMW:[id number] DOB: [birth year] DOA: 04/06/2024  PCP: Brien Belvie FORBES, MD  Admit date: 04/06/2024 Discharge date: 04/13/2024  Admitted From: ( from shelter) Disposition:  (the same) patient mentation at baseline, at this point can go back to his shelter.  Recommendations for Outpatient Follow-up:  Follow up with PCP in 1-2 weeks Please obtain BMP/CBC in one week  Diet recommendation: Heart Healthy    Brief/Interim Summary:   69 y.o. male with medical history significant for chronic hepatitis C, CKD 3B with baseline creatinine 1.5-1.9, who is admitted to [facility] on 04/06/2024 with acute metabolic encephalopathy in the setting of influenza A infection after presenting from homeless shelter to [facility] ED for evaluation of altered mental status.    The patient, who has been residing at homeless shelter, was sent over after staff at shelter noted the patient to be confused, altered, agitated relative to his baseline mental status.  In the ER he was diagnosed with influenza A infection, metabolic encephalopathy, placed under IVC by the ER physician and admitted to the hospital for influenza A infection treatment and encephalopathy management.   Acute metabolic encephalopathy.  - in a patient who lives in a homeless shelter due to influenza A infection and ensuing inflammation, CT head -with no acute finding, no headache, no focal deficits, he is now alert awake oriented x 3, initially was under IVC in the ER, his IVC was later rescinded as he has been cleared by psych .    Influenza A infection.  - 5 with 5 days of Tamiflu    Extreme agitation and aggressive behavior.   -Has been very aggressive towards the staff, refusing medications and labs intermittently, seen by psychiatry, much improved on Seroquel  .   History of HIV, history of chronic hepatitis C.   - Patient with questionable compliance with home medications, follow with PCP and ID as an  outpatient   AKI on CKD stage IIIb.  - Baseline creatinine 1.5, creatinine peaked at 1.94, he received IV fluids.  He refused repeat labs this morning.   Chronic right hip pain x 2 years.   -According to the patient he had fallen 2 years ago and since then his right hip pain has been hurting, it is getting worse with time according to him, CT and MRI of right hip both nonacute, chronic arthritis/bursitis likely per orthopedics, received short course steroids during hospital stay, thereafter follow-up with Dr. Adina Car orthopedics in the outpatient setting.   Homeless situation.  -TOC consulted .    Discharge Diagnoses:  Principal Problem:   Acute metabolic encephalopathy Active Problems:   Chronic hepatitis C (HCC)   CKD stage 3b, GFR 30-44 ml/min (HCC)   Severe sepsis (HCC)   Influenza A   Fever    Discharge Instructions  Discharge Instructions     Ambulatory referral to Physical Therapy   Complete by: As directed    Discharge instructions   Complete by: As directed    Follow with Primary MD Brien Belvie FORBES, MD in 7 days   Get CBC, CMP, Magnesium, 2 view Chest X ray -  checked next visit with your primary MD   Activity: As tolerated with Full fall precautions use walker/cane & assistance as needed  Disposition Home    Diet: Heart Healthy    Special Instructions: If you have smoked or chewed Tobacco  in the last 2 yrs please stop smoking, stop any regular Alcohol  and or any  Recreational drug use.  On your next visit with your primary care physician please Get Medicines reviewed and adjusted.  Please request your Prim.MD to go over all Hospital Tests and Procedure/Radiological results at the follow up, please get all Hospital records sent to your Prim MD by signing hospital release before you go home.  If you experience worsening of your admission symptoms, develop shortness of breath, life threatening emergency, suicidal or homicidal thoughts you must seek medical  attention immediately by calling 911 or calling your MD immediately  if symptoms less severe.  You Must read complete instructions/literature along with all the possible adverse reactions/side effects for all the Medicines you take and that have been prescribed to you. Take any new Medicines after you have completely understood and accpet all the possible adverse reactions/side effects.   Do not drive when taking Pain medications.  Do not take more than prescribed Pain, Sleep and Anxiety Medications  Wear Seat belts while driving.   Increase activity slowly   Complete by: As directed    Increase activity slowly   Complete by: As directed       Allergies as of 04/13/2024   No Known Allergies      Medication List     STOP taking these medications    predniSONE  10 MG tablet Commonly known as: DELTASONE        TAKE these medications    acetaminophen  325 MG tablet Commonly known as: TYLENOL  Take 2 tablets (650 mg total) by mouth every 6 (six) hours as needed for mild pain (pain score 1-3) (or Fever >/= 101).   albuterol  108 (90 Base) MCG/ACT inhaler Commonly known as: VENTOLIN  HFA Inhale 2 puffs into the lungs every 6 (six) hours as needed for wheezing or shortness of breath.   diclofenac  Sodium 1 % Gel Commonly known as: VOLTAREN  Apply 2 g topically 4 (four) times daily.   guaiFENesin -dextromethorphan  100-10 MG/5ML syrup Commonly known as: ROBITUSSIN DM Take 10 mLs by mouth every 8 (eight) hours as needed for cough.   Mavyret  100-40 MG Tabs Generic drug: Glecaprevir -Pibrentasvir  Take 3 tablets by mouth daily with breakfast.   QUEtiapine  25 MG tablet Commonly known as: SEROQUEL  Take 1 tablet (25 mg total) by mouth 2 (two) times daily with a meal.        Follow-up Information     [name], MD. Schedule an appointment as soon as possible for a visit in 1 week(s).   Specialty: Pulmonary Disease Contact information: [address] [place]  [place] [address] [phone]         Luiz Channel, MD. Schedule an appointment as soon as possible for a visit in 1 week(s).   Specialty: Infectious Diseases Why: HIV Contact information: [address] [place] [address] [phone]         Ernie Cough, MD. Schedule an appointment as soon as possible for a visit in 1 week(s).   Specialty: Orthopedic Surgery Contact information: [address] [place] [address] [phone]                Allergies[1]  Consultations: Psychiatry Orthopedic   Procedures/Studies: MR HIP RIGHT WO CONTRAST Result Date: 04/09/2024 CLINICAL DATA:  Right hip pain. EXAM: MR OF THE RIGHT HIP WITHOUT CONTRAST TECHNIQUE: Multiplanar, multisequence MR imaging was performed. No intravenous contrast was administered. COMPARISON:  CT scan 04/08/2024 FINDINGS: Examination is somewhat limited by patient motion. Both hips are normally located. Moderate age advanced degenerative  changes bilaterally, left slightly more significant and right. There is cartilage thinning, joint space narrowing, early spurring and subchondral cystic change. Small bilateral hip joint effusions also noted. 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 I do not see any MR findings to suggest femoroacetabular impingement. No over coverage or under coverage of the hip. No stress fracture or AVN involving the right hip. 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 The pubic symphysis and SI joints are intact. No pelvic fractures or bone lesions. No findings for sacroiliitis. The sacral nerve roots appear normal, surrounded by fat in the neural foramen. No mass or mass effect on the sciatic nerve complexes. No findings for. Trochanteric tendinosis or bursitis. The hip and musculature will. No muscle tear myositis or mass. Mild bilateral hamstring tendinopathy but no tear. No significant intrapelvic abnormalities are  identified. No inguinal mass or hernia. IMPRESSION: 1. Moderate age advanced bilateral hip joint degenerative changes, left slightly more significant and right. 2. 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 3. No stress fracture or AVN involving the right hip. 4. 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 5. No significant intrapelvic abnormalities. Electronically Signed   By: MYRTIS Stammer M.D.   On: 04/09/2024 15:00   CT HIP RIGHT WO CONTRAST Result Date: 04/08/2024 CLINICAL DATA:  Hip pain, stress fracture suspected. EXAM: CT OF THE RIGHT HIP WITHOUT CONTRAST TECHNIQUE: Multidetector CT imaging of the right hip was performed according to the standard protocol. Multiplanar CT image reconstructions were also generated. RADIATION DOSE REDUCTION: This exam was performed according to the departmental dose-optimization program which includes automated exposure control, adjustment of the mA and/or kV according to patient size and/or use of iterative reconstruction technique. COMPARISON:  04/07/2024. FINDINGS: Bones/Joint/Cartilage No evidence of acute fracture or dislocation is seen. Degenerative changes are noted in the lower lumbar spine and there are mild degenerative changes at the right hip. No joint effusion is seen. Ligaments Suboptimally assessed by CT. Muscles and Tendons No intramuscular hematoma. Soft tissues No subcutaneous hematoma is seen. Aortic atherosclerosis. Diverticulosis without diverticulitis. Small fat containing umbilical hernia. No free fluid in the pelvis. IMPRESSION: 1. No acute fracture or dislocation at the right hip. If clinical concern for stress fracture persists, MRI is recommended. 2. Degenerative changes at the right hip and lower lumbar spine. 3. Aortic atherosclerosis. Electronically Signed   By: Leita Birmingham M.D.   On: 04/08/2024 16:03   DG Hip Unilat W or Wo Pelvis 2-3 Views Right Result Date: 04/07/2024 EXAM: 2 or more VIEW(S) XRAY OF THE  BILATERAL HIP 04/07/2024 12:51:00 AM COMPARISON: 12/16/2023 CLINICAL HISTORY: fall, hip pain FINDINGS: BONES AND JOINTS: No acute fracture. No malalignment. Mild degenerative changes of the hips. SOFT TISSUES: Vascular calcifications. LUMBAR SPINE: Degenerative changes of the lower lumbar spine. IMPRESSION: 1. No acute fracture or dislocation. 2. Mild degenerative changes of the hips. Electronically signed by: Franky Stanford MD 04/07/2024 12:59 AM EST RP Workstation: [id number]   CT Head Wo Contrast Result Date: 04/07/2024 EXAM: CT HEAD WITHOUT 04/07/2024 12:54:34 AM TECHNIQUE: CT of the head was performed without the administration of intravenous contrast. Automated exposure control, iterative reconstruction, and/or weight based adjustment of the mA/kV was utilized to reduce the radiation dose to as low as reasonably achievable. COMPARISON: None available. CLINICAL HISTORY: Head trauma, minor (Age >= 65y). FINDINGS: BRAIN AND VENTRICLES: No acute intracranial hemorrhage. No mass effect or midline shift. No extra-axial fluid collection. No evidence of acute infarct.  Unchanged severe ventriculomegaly with chronic ischemic white matter changes. There is an acute callosal angle. ORBITS: No acute abnormality. SINUSES AND MASTOIDS: No acute abnormality. SOFT TISSUES AND SKULL: No acute skull fracture. No acute soft tissue abnormality. IMPRESSION: 1. No acute intracranial abnormality. 2. Unchanged severe ventriculomegaly with chronic ischemic white matter changes. Electronically signed by: Franky Stanford MD 04/07/2024 12:59 AM EST RP Workstation: [id number]   DG Chest 1 View Result Date: 04/06/2024 EXAM: 1 VIEW(S) XRAY OF THE CHEST 04/06/2024 09:51:00 PM COMPARISON: 10/27/2023 CLINICAL HISTORY: Sepsis (HCC) FINDINGS: LUNGS AND PLEURA: No focal pulmonary opacity. No pleural effusion. No pneumothorax. HEART AND MEDIASTINUM: Atherosclerotic calcifications of the aortic arch. No acute abnormality of the cardiac and  mediastinal silhouettes. BONES AND SOFT TISSUES: No acute osseous abnormality. IMPRESSION: 1. No acute cardiopulmonary abnormality. 2. Atherosclerotic calcifications of the aortic arch. Electronically signed by: Dorethia Molt MD 04/06/2024 10:10 PM EST RP Workstation: [id number]      Subjective:  No significant events overnight as discussed with staff, this morning he denies any new complaints, he was asking about help with transport back to the shelter,  Discharge Exam: Vitals:   04/12/24 2025 04/13/24 0510  BP: (!) 141/78 130/76  Pulse: 73 (!) 56  Resp: 16 14  Temp: 97.6 F (36.4 C) 97.7 F (36.5 C)  SpO2: 97% 98%   Vitals:   04/12/24 1204 04/12/24 1639 04/12/24 2025 04/13/24 0510  BP: 122/70 122/73 (!) 141/78 130/76  Pulse: 66 69 73 (!) 56  Resp: 19  16 14   Temp: 98.3 F (36.8 C)  97.6 F (36.4 C) 97.7 F (36.5 C)  TempSrc: Oral  Oral   SpO2:  95% 97% 98%  Weight:    52.9 kg  Height:        General: Pt is alert, awake, impulsive, no apparent distress Cardiovascular: RRR, S1/S2 +, no rubs, no gallops Respiratory: CTA bilaterally, no wheezing, no rhonchi Abdominal: Soft, NT, ND, bowel sounds + Extremities: no edema, no cyanosis    The results of significant diagnostics from this hospitalization (including imaging, microbiology, ancillary and laboratory) are listed below for reference.     Microbiology: Recent Results (from the past 240 hours)  Blood Culture (routine x 2)     Status: None   Collection Time: 04/06/24  9:28 PM   Specimen: BLOOD LEFT FOREARM  Result Value Ref Range Status   Specimen Description BLOOD LEFT FOREARM  Final   Special Requests   Final    BOTTLES DRAWN AEROBIC AND ANAEROBIC Blood Culture results may not be optimal due to an inadequate volume of blood received in culture bottles   Culture   Final    NO GROWTH 5 DAYS Performed at [facility], [address] [place], [place] [address]    Report Status 04/11/2024 FINAL  Final   Resp panel by RT-PCR (RSV, Flu A&B, Covid) Anterior Nasal Swab     Status: Abnormal   Collection Time: 04/06/24  9:28 PM   Specimen: Anterior Nasal Swab  Result Value Ref Range Status   SARS Coronavirus 2 by RT PCR NEGATIVE NEGATIVE Final   Influenza A by PCR POSITIVE (A) NEGATIVE Final   Influenza B by PCR NEGATIVE NEGATIVE Final    Comment: (NOTE) The Xpert Xpress SARS-CoV-2/FLU/RSV plus assay is intended as an aid in the diagnosis of influenza from Nasopharyngeal swab specimens and should not be used as a sole basis for treatment. Nasal washings and aspirates are unacceptable for Xpert Xpress SARS-CoV-2/FLU/RSV testing.  Fact Sheet for Patients: bloggercourse.com  Fact Sheet for Healthcare Providers: seriousbroker.it  This test is not yet approved or cleared by the [place]  FDA and has been authorized for detection and/or diagnosis of SARS-CoV-2 by FDA under an Emergency Use Authorization (EUA). This EUA will remain in effect (meaning this test can be used) for the duration of the COVID-19 declaration under Section 564(b)(1) of the Act, 21 U.S.C. section 360bbb-3(b)(1), unless the authorization is terminated or revoked.     Resp Syncytial Virus by PCR NEGATIVE NEGATIVE Final    Comment: (NOTE) Fact Sheet for Patients: bloggercourse.com  Fact Sheet for Healthcare Providers: seriousbroker.it  This test is not yet approved or cleared by the [place]  FDA and has been authorized for detection and/or diagnosis of SARS-CoV-2 by FDA under an Emergency Use Authorization (EUA). This EUA will remain in effect (meaning this test can be used) for the duration of the COVID-19 declaration under Section 564(b)(1) of the Act, 21 U.S.C. section 360bbb-3(b)(1), unless the authorization is terminated or revoked.  Performed at [facility], [address], [place],  [place] [address]      Labs: BNP (last 3 results) No results for input(s): BNP in the last 8760 hours. Basic Metabolic Panel: Recent Labs  Lab 04/06/24 2245 04/06/24 2338 04/08/24 1858 04/12/24 0233  NA 133* 135 136 135  K 4.7 4.4 3.7 5.0  CL 100 103 103 105  CO2 20*  --  20* 21*  GLUCOSE 100* 99 105* 141*  BUN 24* 29* 29* 43*  CREATININE 1.63* 1.60* 1.59* 1.94*  CALCIUM  9.3  --  8.4* 8.6*  MG  --   --  2.2 2.8*   Liver Function Tests: Recent Labs  Lab 04/06/24 2245 04/08/24 1858  AST 58* 50*  ALT 40 30  ALKPHOS 102 87  BILITOT 0.3 0.2  PROT 7.8 6.7  ALBUMIN 4.3 3.6   No results for input(s): LIPASE, AMYLASE in the last 168 hours. Recent Labs  Lab 04/08/24 1858  AMMONIA 33   CBC: Recent Labs  Lab 04/06/24 2245 04/06/24 2338 04/07/24 0422 04/08/24 1858 04/12/24 0233  WBC 5.0  --  3.8* 3.0* 10.3  NEUTROABS 3.7  --  2.0 0.9* 8.9*  HGB 12.2* 12.6* 11.7* 12.0* 11.4*  HCT 36.5* 37.0* 35.0* 35.5* 34.3*  MCV 99.2  --  98.6 97.0 98.3  PLT 142*  --  113* 130* 133*   Cardiac Enzymes: No results for input(s): CKTOTAL, CKMB, CKMBINDEX, TROPONINI in the last 168 hours. BNP: Invalid input(s): POCBNP CBG: No results for input(s): GLUCAP in the last 168 hours. D-Dimer No results for input(s): DDIMER in the last 72 hours. Hgb A1c No results for input(s): HGBA1C in the last 72 hours. Lipid Profile No results for input(s): CHOL, HDL, LDLCALC, TRIG, CHOLHDL, LDLDIRECT in the last 72 hours. Thyroid function studies No results for input(s): TSH, T4TOTAL, T3FREE, THYROIDAB in the last 72 hours.  Invalid input(s): FREET3 Anemia work up No results for input(s): VITAMINB12, FOLATE, FERRITIN, TIBC, IRON, RETICCTPCT in the last 72 hours. Urinalysis    Component Value Date/Time   COLORURINE YELLOW 04/06/2024 2128   APPEARANCEUR CLEAR 04/06/2024 2128   LABSPEC 1.013 04/06/2024 2128   PHURINE 5.0 04/06/2024 2128    GLUCOSEU NEGATIVE 04/06/2024 2128   HGBUR MODERATE (A) 04/06/2024 2128   BILIRUBINUR NEGATIVE 04/06/2024 2128   KETONESUR 5 (A) 04/06/2024 2128   PROTEINUR NEGATIVE 04/06/2024 2128   UROBILINOGEN 0.2 05/29/2010 2245   NITRITE NEGATIVE 04/06/2024  2128   LEUKOCYTESUR NEGATIVE 04/06/2024 2128   Sepsis Labs Recent Labs  Lab 04/06/24 2245 04/07/24 0422 04/08/24 1858 04/12/24 0233  WBC 5.0 3.8* 3.0* 10.3   Microbiology Recent Results (from the past 240 hours)  Blood Culture (routine x 2)     Status: None   Collection Time: 04/06/24  9:28 PM   Specimen: BLOOD LEFT FOREARM  Result Value Ref Range Status   Specimen Description BLOOD LEFT FOREARM  Final   Special Requests   Final    BOTTLES DRAWN AEROBIC AND ANAEROBIC Blood Culture results may not be optimal due to an inadequate volume of blood received in culture bottles   Culture   Final    NO GROWTH 5 DAYS Performed at [facility] Lab, [address] [place], [place] [address]    Report Status 04/11/2024 FINAL  Final  Resp panel by RT-PCR (RSV, Flu A&B, Covid) Anterior Nasal Swab     Status: Abnormal   Collection Time: 04/06/24  9:28 PM   Specimen: Anterior Nasal Swab  Result Value Ref Range Status   SARS Coronavirus 2 by RT PCR NEGATIVE NEGATIVE Final   Influenza A by PCR POSITIVE (A) NEGATIVE Final   Influenza B by PCR NEGATIVE NEGATIVE Final    Comment: (NOTE) The Xpert Xpress SARS-CoV-2/FLU/RSV plus assay is intended as an aid in the diagnosis of influenza from Nasopharyngeal swab specimens and should not be used as a sole basis for treatment. Nasal washings and aspirates are unacceptable for Xpert Xpress SARS-CoV-2/FLU/RSV testing.  Fact Sheet for Patients: bloggercourse.com  Fact Sheet for Healthcare Providers: seriousbroker.it  This test is not yet approved or cleared by the [place]  FDA and has been authorized for detection and/or diagnosis of SARS-CoV-2  by FDA under an Emergency Use Authorization (EUA). This EUA will remain in effect (meaning this test can be used) for the duration of the COVID-19 declaration under Section 564(b)(1) of the Act, 21 U.S.C. section 360bbb-3(b)(1), unless the authorization is terminated or revoked.     Resp Syncytial Virus by PCR NEGATIVE NEGATIVE Final    Comment: (NOTE) Fact Sheet for Patients: bloggercourse.com  Fact Sheet for Healthcare Providers: seriousbroker.it  This test is not yet approved or cleared by the [place]  FDA and has been authorized for detection and/or diagnosis of SARS-CoV-2 by FDA under an Emergency Use Authorization (EUA). This EUA will remain in effect (meaning this test can be used) for the duration of the COVID-19 declaration under Section 564(b)(1) of the Act, 21 U.S.C. section 360bbb-3(b)(1), unless the authorization is terminated or revoked.  Performed at [facility], [address] [place], [place] [address]      Time coordinating discharge: Over 30 minutes  SIGNED:   Brayton Lye, MD  [facility] 04/13/2024, 11:18 AM Pager   If 7PM-7AM, please contact night-coverage [email or web address]     [1] No Known Allergies

## 2024-04-13 NOTE — Progress Notes (Signed)
 Patient isn't lounge appropriate due to attitude and incontinence issues. According to RN he is also a hight falls risk.

## 2024-04-13 NOTE — Consult Note (Signed)
 Collateral information: On 04/12/2024:  I contacted the patients sister [facility] yesterday and she indicated that he is at his baseline and in general he is irritable and rude by her report. She reports that he has never had any psychiatric care and has denied any need for it. He has not had any prior history of suicidal attempts by her report and he finds resources to take care of himself. She did not express any concerns about him and stated that she had talked to him yesterday.  Paulette Brand [facility] Psychiatrist

## 2024-04-13 NOTE — Progress Notes (Signed)
 CSW notes patient refusal of assistance in locating an ALF and that patient has no where to go but is insisting on being discharged. [facility] aware and has been cleared by MD as there are no grounds to IVC. Per RNCM, patient's sister voiced concerns over lack of disposition and cannot house patient, however hospital is unable to force patient to sign over his check to an assisted living facility. Patient has his cell phone. To ensure patient has follow up, CSW contacted [facility] and was told someone will call CSW back to hear the report.   Joyann Spidle LCSW, MSW, [facility]

## 2024-04-13 NOTE — Progress Notes (Signed)
 Physical Therapy Treatment Patient Details Name: Jeffrey [name] MRN: [id number] DOB: 12-20-1955 Today's Date: 04/13/2024   History of Present Illness 69 y.o. who presented 04/06/24 to [facility] ED with AMS and fall. Pt with Acute metabolic encephalopathy and Influenza A infection. PMHx:  HIV, chronic hepatitis C, and CKD.    PT Comments  Pt received sitting EOB and agreeable to session. Pt able to increase gait distance with SPC and no noted buckling or LOB. Pt continues to demonstrate instability and is limited by R hip pain. Pt demonstrates improved stability with rollator and pt reports increased comfort with BUE support. Education provided on exercises to increase R hip strength and stability, however pt states it won't help. Pt continues to benefit from PT services to progress toward functional mobility goals.    If plan is discharge home, recommend the following: A little help with walking and/or transfers;A little help with bathing/dressing/bathroom;Assist for transportation;Help with stairs or ramp for entrance   Can travel by private vehicle        Equipment Recommendations   (Pt reportedly has a rollator and SPC)    Recommendations for Other Services       Precautions / Restrictions Precautions Precautions: Fall Recall of Precautions/Restrictions: Impaired Restrictions Weight Bearing Restrictions Per Provider Order: No     Mobility  Bed Mobility Overal bed mobility: Modified Independent             General bed mobility comments: Pt sitting EOB at beginning and end of session    Transfers Overall transfer level: Needs assistance Equipment used: Straight cane Transfers: Sit to/from Stand Sit to Stand: Supervision           General transfer comment: increased time and effort for rise.    Ambulation/Gait Ambulation/Gait assistance: Contact guard assist Gait Distance (Feet): 150 Feet Assistive device: Straight cane Gait Pattern/deviations: Trunk flexed,  Step-through pattern, Decreased stride length Gait velocity: decr     General Gait Details: Pt demonstrates short steps with low foot clearance despite frequent cues. some instability, but no LOB   Stairs             Wheelchair Mobility     Tilt Bed    Modified Rankin (Stroke Patients Only)       Balance Overall balance assessment: History of Falls, Needs assistance Sitting-balance support: Feet supported, No upper extremity supported Sitting balance-Leahy Scale: Good     Standing balance support: Reliant on assistive device for balance, During functional activity, Single extremity supported Standing balance-Leahy Scale: Poor Standing balance comment: with SPC and CGA                            Communication Communication Communication: Impaired Factors Affecting Communication: Hearing impaired  Cognition Arousal: Alert Behavior During Therapy: Flat affect, Agitated   PT - Cognitive impairments: No family/caregiver present to determine baseline, Problem solving, Safety/Judgement, Awareness, Attention, Initiation, Sequencing                         Following commands: Impaired Following commands impaired: Only follows one step commands consistently, Follows one step commands with increased time    Cueing Cueing Techniques: Verbal cues, Gestural cues  Exercises      General Comments        Pertinent Vitals/Pain Pain Assessment Pain Assessment: Faces Faces Pain Scale: Hurts little more Pain Location: R hip Pain Descriptors / Indicators: Grimacing, Discomfort Pain Intervention(s):  Limited activity within patient's tolerance, Monitored during session, Repositioned     PT Goals (current goals can now be found in the care plan section) Acute Rehab PT Goals Patient Stated Goal: Leave asap PT Goal Formulation: With patient Time For Goal Achievement: 04/23/24 Progress towards PT goals: Progressing toward goals    Frequency     Min 2X/week       AM-PAC PT 6 Clicks Mobility   Outcome Measure  Help needed turning from your back to your side while in a flat bed without using bedrails?: None Help needed moving from lying on your back to sitting on the side of a flat bed without using bedrails?: None Help needed moving to and from a bed to a chair (including a wheelchair)?: A Little Help needed standing up from a chair using your arms (e.g., wheelchair or bedside chair)?: A Little Help needed to walk in hospital room?: A Little Help needed climbing 3-5 steps with a railing? : A Little 6 Click Score: 20    End of Session Equipment Utilized During Treatment: Gait belt Activity Tolerance: Patient tolerated treatment well;Patient limited by pain;Patient limited by fatigue Patient left: in bed;with call bell/phone within reach (sitting EOB) Nurse Communication: Mobility status PT Visit Diagnosis: History of falling (Z91.81);Other abnormalities of gait and mobility (R26.89);Difficulty in walking, not elsewhere classified (R26.2);Unsteadiness on feet (R26.81)     Time: 1205-1220 PT Time Calculation (min) (ACUTE ONLY): 15 min  Charges:    $Gait Training: 8-22 mins PT General Charges $$ ACUTE PT VISIT: 1 Visit                    Darryle George, PTA Acute Rehabilitation Services Secure Chat Preferred  [phone]    Darryle George 04/13/2024, 1:08 PM

## 2024-04-14 ENCOUNTER — Emergency Department (HOSPITAL_COMMUNITY)
Admission: EM | Admit: 2024-04-14 | Discharge: 2024-04-14 | Disposition: A | Discharge status: Home / Self Care | Attending: Emergency Medicine | Admitting: Emergency Medicine

## 2024-04-14 ENCOUNTER — Other Ambulatory Visit (HOSPITAL_COMMUNITY): Payer: Self-pay

## 2024-04-14 ENCOUNTER — Telehealth: Payer: Self-pay | Admitting: *Deleted

## 2024-04-14 ENCOUNTER — Other Ambulatory Visit: Payer: Self-pay

## 2024-04-14 DIAGNOSIS — M25551 Pain in right hip: Secondary | ICD-10-CM | POA: Diagnosis present

## 2024-04-14 DIAGNOSIS — Z5900 Homelessness unspecified: Secondary | ICD-10-CM | POA: Diagnosis not present

## 2024-04-14 DIAGNOSIS — N189 Chronic kidney disease, unspecified: Secondary | ICD-10-CM | POA: Insufficient documentation

## 2024-04-14 MED ORDER — LIDOCAINE 5 % EX PTCH
1.0000 | MEDICATED_PATCH | CUTANEOUS | Status: DC
  Filled 2024-04-14: qty 1

## 2024-04-14 MED ORDER — ACETAMINOPHEN 325 MG PO TABS
650.0000 mg | ORAL_TABLET | Freq: Once | ORAL | Status: AC
  Administered 2024-04-14: 650 mg via ORAL
  Filled 2024-04-14: qty 2

## 2024-04-14 MED ORDER — LIDOCAINE 5 % EX PTCH: 1.0000 | MEDICATED_PATCH | CUTANEOUS | 0 refills | Status: AC

## 2024-04-14 NOTE — Discharge Instructions (Addendum)
 You were seen here today for your hip pain. You had an MRI performed of your hips on 04/09/24 which showed some degenerative changes and labral tears (tearing in the cushioning of the hip) which is likely causing some of your hip pain  You need to follow up with a bone doctor (orthopedic doctor) as soon as possible for further treatment. Please contact Orthopedic Dr. Genelle listed below as soon as possible for a follow up appointment regarding your hip pain.   You may take up to 650mg  of tylenol  every 6 hours as needed for pain. You were given a dose here today.  You may use the lidocaine  patches prescribed for pain. You may apply one patch for up to 12 hours at a time. The remove the patch for a full 12 hours before re-applying a new patch.    Please return to the ER for any other new or concerning symptoms

## 2024-04-14 NOTE — Progress Notes (Signed)
 1/9-CSW received call back from [facility] and made a report with Ryan.   Jae Skeet LCSW, MSW, [facility]

## 2024-04-14 NOTE — ED Triage Notes (Addendum)
 Pt reports dx with flu x 2 days ago. Pt here today for hip pain, reports fall 3 weeks ago and was seen but back b/c it hurts. Pt denies any reoccuring injury/trauma and reports was told to see a bone specialist

## 2024-04-14 NOTE — ED Provider Notes (Signed)
 " [place] [facility] AT [facility] Provider Note   CSN: [id number] Arrival date & time: 04/14/24  1053     Patient presents with: Influenza and Hip Pain   Jeffrey [name] is a 69 y.o. male experiencing homelessness with history of CKD, alcoholic cirrhosis, presents with concern for right hip pain that has been ongoing for years. He reports that pain will sometimes radiate down into his right leg. Denies any new injuries. Denies any bowel or bladder incontinence, urinary retention, saddle anesthesia, IVDU, or history of malignancy. Reports he is not taking anything for pain.     Influenza Hip Pain       Prior to Admission medications  Medication Sig Start Date End Date Taking? Authorizing Provider  lidocaine  (LIDODERM ) 5 % Place 1 patch onto the skin daily. Remove & Discard patch within 12 hours or as directed by MD 04/14/24  Yes Veta Palma, PA-C  acetaminophen  (TYLENOL ) 325 MG tablet Take 2 tablets (650 mg total) by mouth every 6 (six) hours as needed for mild pain (pain score 1-3) (or Fever >/= 101). 04/13/24   Elgergawy, Brayton RAMAN, MD  albuterol  (VENTOLIN  HFA) 108 (90 Base) MCG/ACT inhaler Inhale 2 puffs into the lungs every 6 (six) hours as needed for wheezing or shortness of breath. 04/08/24   Singh, Prashant K, MD  diclofenac  Sodium (VOLTAREN ) 1 % GEL Apply 2 g topically 4 (four) times daily. 04/13/24   Elgergawy, Brayton RAMAN, MD  Glecaprevir -Pibrentasvir  (MAVYRET ) 100-40 MG TABS Take 3 tablets by mouth daily with breakfast. Patient not taking: Reported on 04/07/2024 09/09/23   Waddell Palma PARAS, RPH-CPP  guaiFENesin -dextromethorphan  (ROBITUSSIN DM) 100-10 MG/5ML syrup Take 10 mLs by mouth every 8 (eight) hours as needed for cough. 04/08/24   Singh, Prashant K, MD  QUEtiapine  (SEROQUEL ) 25 MG tablet Take 1 tablet (25 mg total) by mouth 2 (two) times daily with a meal. 04/13/24   Elgergawy, Brayton RAMAN, MD    Allergies: Patient has no known allergies.    Review of Systems   Musculoskeletal:        Right hip pain    Updated Vital Signs BP (!) 147/95 (BP Location: Right Arm)   Pulse 95   Temp 98.9 F (37.2 C) (Oral)   Resp 16   SpO2 99%   Physical Exam Vitals and nursing note reviewed.  Constitutional:      Appearance: Normal appearance.  HENT:     Head: Atraumatic.  Cardiovascular:     Rate and Rhythm: Normal rate and regular rhythm.     Comments: 2+ pedal pulses bilaterally Pulmonary:     Effort: Pulmonary effort is normal.  Musculoskeletal:     Comments: General No obvious deformity. No erythema, edema, contusions, open wounds   Palpation Tender over the right ASIS and into the hip crease  No tenderness over the cervical, lumbar, or thoracic spine No tenderness over the right femur   ROM Full hip and knee flexion and extension bilaterally. Full ankle plantarflexion and dorsiflexion bilaterally Ambulated with antalgic gait due to pain but does not require assistance  Special tests No ligamentous laxity with Lachman's or posterior drawer testing   Sensation: Sensation intact throughout the lower extremity bilaterally  Strength: 5/5 strength with resisted hip and knee flexion and extension bilaterally 4/5 strength with resisted ankle plantarflexion and dorsiflexion on right compared to 5/5 on left    Neurological:     General: No focal deficit present.     Mental Status:  He is alert.  Psychiatric:        Mood and Affect: Mood normal.        Behavior: Behavior normal.     (all labs ordered are listed, but only abnormal results are displayed) Labs Reviewed - No data to display  EKG: None  Radiology: No results found.   Procedures   Medications Ordered in the ED  lidocaine  (LIDODERM ) 5 % 1 patch (1 patch Transdermal Patient Refused/Not Given 04/14/24 1310)  acetaminophen  (TYLENOL ) tablet 650 mg (650 mg Oral Given 04/14/24 1310)                                    Medical Decision Making    Differential diagnosis  includes but is not limited to labral tear, osteoarthritis, AVN, FAI, musculoskeletal pain, radiculopathy, spinal stenosis, herniated nucleus pulposis, fracture, cauda equina, epidural abscess, shingles, nephrolithiasis, UTI, Pyelonephritis   ED Course:  Upon initial evaluation, patient is well appearing, no acute distress. He is reporting chronic right hip pain. No new injuries. Has tenderness over the right ASIS and into the right hip crease. Slightly diminished strength with 4/5 dorsiflexion on the right compared to 5/5 on the left. Otherwise, 5/5 strength bilaterally with hip flexion and extension, knee flexion and extension. Intact and symmetric sensation in the bilateral lower extremities. 2+ pedal pulses bilaterally. Able to ambulate, but with mild antalgic gait. Does not require assistance with ambulation.  Patient recently had MRI of the right hip performed 5 days ago which I reviewed. This showed degenerative changed in the right hip and a possible labral tear. This is likely the source of patient's chronic pain. Given no new injuries, no indication for repeat imaging at this time.  Patient without any red-flag symptoms such as saddle anesthesia, bowel or bladder incontinence, urinary retention, no history of IVDU or malignancy, no fevers, very low clinical concern for epidural abscess, cauda equina, spinal malignancy, or other emergent pathology.   Patient stable and appropriate for discharge home  Medications Given: Tylenol  Lidocaine  patch   Impression: Chronic right hip pain, likely from labral tear and degenerative changes  Disposition:  Patient discharged home with instructions to take tylenol  as needed for pain. Will avoid NSAIDs due to CKD and will avoid opioids as this is a chronic problem and there is also concern for increased sedation due to history of alcohol use. Heating packs on the back to help with pain.  Lidocaine  patches as needed for pain.  He understands he needs to  call to schedule a follow-up with Dr. Genelle with orthopedics as soon as possible for further treatment. Dr. Danetta contact information was provided.  Return precautions given and patient verbalized understanding.    Record Review: External records from outside source obtained and reviewed including MRI right hip from 5 days ago on 04/14/24: IMPRESSION: 1. Moderate age advanced bilateral hip joint degenerative changes, left slightly more significant and right. 2. 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 3. No stress fracture or AVN involving the right hip. 4. 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 5. No significant intrapelvic abnormalities.   Reviewed BMP from 04/12/24 with CKD creatinine 1.94  This chart was dictated using voice recognition software, Dragon. Despite the best efforts of this provider to proofread and correct errors, errors may still occur which can change documentation meaning.       Final diagnoses:  Right hip  pain    ED Discharge Orders          Ordered    lidocaine  (LIDODERM ) 5 %  Every 24 hours        04/14/24 1300               Veta Palma, [place] 04/14/24 1321  "

## 2024-04-14 NOTE — Transitions of Care (Post Inpatient/ED Visit) (Signed)
" ° °  04/14/2024  Name: Jeffrey [name] MRN: [id number] DOB: 02-17-1956  Today's TOC FU Call Status: Today's TOC FU Call Status:: Successful TOC FU Call Completed TOC FU Call Complete Date: 04/14/24  Patient's Name and Date of Birth confirmed. Name, DOB  Transition Care Management Follow-up Telephone Call Date of Discharge: 04/13/24 Discharge Facility: [facility]) Type of Discharge: Inpatient Admission Primary Inpatient Discharge Diagnosis:: Acute metabolic encephalopathy How have you been since you were released from the hospital?: Worse (Patient reports feeling worse and is going back to the hospital.)  Items Reviewed: Did you receive and understand the discharge instructions provided?: Yes  Medications Reviewed Today: Medications Reviewed Today     Reviewed by Lucky Andrea LABOR, RN (Registered Nurse) on 04/14/24 at [phone]  Med List Status: <None>   Medication Order Taking? Sig Documenting Provider Last Dose Status Informant  acetaminophen  (TYLENOL ) 325 MG tablet [id number]  Take 2 tablets (650 mg total) by mouth every 6 (six) hours as needed for mild pain (pain score 1-3) (or Fever >/= 101). Elgergawy, Brayton RAMAN, MD  Active   albuterol  (VENTOLIN  HFA) 108 (90 Base) MCG/ACT inhaler [id number]  Inhale 2 puffs into the lungs every 6 (six) hours as needed for wheezing or shortness of breath. Singh, Prashant K, MD  Active   diclofenac  Sodium (VOLTAREN ) 1 % GEL [id number]  Apply 2 g topically 4 (four) times daily. Elgergawy, Brayton RAMAN, MD  Active   Glecaprevir -Pibrentasvir  (MAVYRET ) 100-40 MG TABS [id number] No Take 3 tablets by mouth daily with breakfast.  Patient not taking: Reported on 04/07/2024   Waddell Alan PARAS, RPH-CPP Not Taking Active Self, Pharmacy Records  guaiFENesin -dextromethorphan  [facility] DM) 100-10 MG/5ML syrup [id number]  Take 10 mLs by mouth every 8 (eight) hours as needed for cough. Singh, Prashant K, MD  Active   QUEtiapine  (SEROQUEL ) 25 MG tablet [id number]  Take 1 tablet (25 mg  total) by mouth 2 (two) times daily with a meal. Elgergawy, Brayton RAMAN, MD  Active             Home Care and Equipment/Supplies:    Functional Questionnaire:    Follow up appointments reviewed:    Patient reports feeling worse with flu symptoms and is planning to return to the hospital. Declined completion of TOC Assessment and Medication Review.  Andrea Lucky RN, BSN [place]  [facility] RN Care Manager [phone]  "

## 2024-04-17 ENCOUNTER — Telehealth: Payer: Self-pay | Admitting: Critical Care Medicine

## 2024-04-17 ENCOUNTER — Ambulatory Visit (HOSPITAL_BASED_OUTPATIENT_CLINIC_OR_DEPARTMENT_OTHER): Admitting: Student

## 2024-04-17 NOTE — Telephone Encounter (Signed)
 Contacted pt to confirmed appt and give instruction to the new clinic mailbox not setup. If the pt calls back please give him the address for his appt thank you. Primary Care at [facility] [address] [place], [place] [address] [phone].

## 2024-04-17 NOTE — Progress Notes (Signed)
 1/12 12:17 PM-CSW received call from [facility] with [facility] APS Etha Chang [phone]) and answered questions about the patient. She is continuing to search for him.  Borghild Thaker LCSW, MSW, [facility]

## 2024-04-19 ENCOUNTER — Other Ambulatory Visit (HOSPITAL_COMMUNITY): Payer: Self-pay

## 2024-04-30 ENCOUNTER — Other Ambulatory Visit (HOSPITAL_COMMUNITY): Payer: Self-pay
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
## 2024-05-02 ENCOUNTER — Telehealth: Payer: Self-pay | Admitting: Critical Care Medicine

## 2024-05-02 NOTE — Telephone Encounter (Signed)
 05/02/24 Called and confirmed appointment. Patient stated that he needed transportation and will call back with address.

## 2024-05-04 ENCOUNTER — Ambulatory Visit: Payer: Self-pay | Admitting: Critical Care Medicine

## 2024-05-09 ENCOUNTER — Ambulatory Visit: Admitting: Physical Therapy
